# Patient Record
Sex: Male | Born: 1945
Health system: Southern US, Community
[De-identification: ages and names within clinical notes are randomized; demographics above are authoritative.]

## PROBLEM LIST (undated history)

## (undated) DIAGNOSIS — E7800 Pure hypercholesterolemia, unspecified: Secondary | ICD-10-CM

## (undated) DIAGNOSIS — I10 Essential (primary) hypertension: Secondary | ICD-10-CM

## (undated) DIAGNOSIS — Z9889 Other specified postprocedural states: Secondary | ICD-10-CM

## (undated) HISTORY — DX: Pure hypercholesterolemia, unspecified: E78.00

## (undated) HISTORY — DX: Essential (primary) hypertension: I10

## (undated) HISTORY — DX: Other specified postprocedural states: Z98.890

---

## 2003-04-14 DIAGNOSIS — Z9889 Other specified postprocedural states: Secondary | ICD-10-CM

## 2003-04-14 HISTORY — PX: COLONOSCOPY: SHX174

## 2003-04-14 HISTORY — DX: Other specified postprocedural states: Z98.890

## 2003-05-25 ENCOUNTER — Ambulatory Visit (HOSPITAL_COMMUNITY): Admission: RE | Admit: 2003-05-25 | Discharge: 2003-05-25 | Payer: Self-pay | Admitting: Internal Medicine

## 2010-08-07 ENCOUNTER — Encounter: Payer: Self-pay | Admitting: Gastroenterology

## 2010-08-07 ENCOUNTER — Ambulatory Visit (HOSPITAL_COMMUNITY)
Admission: RE | Admit: 2010-08-07 | Discharge: 2010-08-07 | Disposition: A | Discharge status: Home / Self Care | Payer: Medicare Other | Source: Ambulatory Visit | Attending: Gastroenterology | Admitting: Gastroenterology

## 2010-08-07 ENCOUNTER — Ambulatory Visit (INDEPENDENT_AMBULATORY_CARE_PROVIDER_SITE_OTHER): Payer: Medicare Other | Admitting: Gastroenterology

## 2010-08-07 VITALS — BP 155/83 | HR 93 | Temp 99.3°F | Ht 68.0 in | Wt 166.6 lb

## 2010-08-07 DIAGNOSIS — R1032 Left lower quadrant pain: Secondary | ICD-10-CM | POA: Insufficient documentation

## 2010-08-07 DIAGNOSIS — N201 Calculus of ureter: Secondary | ICD-10-CM | POA: Insufficient documentation

## 2010-08-07 DIAGNOSIS — K7689 Other specified diseases of liver: Secondary | ICD-10-CM | POA: Insufficient documentation

## 2010-08-07 DIAGNOSIS — Z01818 Encounter for other preprocedural examination: Secondary | ICD-10-CM

## 2010-08-07 LAB — CREATININE, SERUM: Creat: 1.52 mg/dL — ABNORMAL HIGH (ref 0.40–1.50)

## 2010-08-07 MED ORDER — IOHEXOL 300 MG/ML  SOLN
100.0000 mL | Freq: Once | INTRAMUSCULAR | Status: AC | PRN
  Administered 2010-08-07: 100 mL via INTRAVENOUS

## 2010-08-07 NOTE — Progress Notes (Signed)
Referring Provider: Glo Herring., MD Primary Care Physician:  Glo Herring., MD Primary Gastroenterologist:  Dr. Gala Romney  Chief Complaint  Patient presents with  . Abdominal Pain    lower abdomin  . Pre-op Exam    cosult for colonoscopy    HPI:  Gordon Bell is a 65 y.o. male here as a referral from Dr. Gerarda Fraction for lower abdominal pain. Pt states on Tues morning, woke up with severe lower abdominal pain, LLQ pain, shooting. Got up to urinate and had +hematuria.  Went to see PCP on Wednesday, given Cipro. No BM since Tuesday am. Feeling nauseated. No further pain since 2am last night. If stands up, eases off. Otherwise, if sitting or laying, uncomfortable. Pain is intermittent. Usually has BM every morning. No blood in stool. Did say felt like had chills, but no fever. Has felt nauseated since abdominal pain started. No significant wt loss.  Will occasionally take Pepcid once or twice a week. No indigestion every day. No dysphagia.  Last colonoscopy in 2005: sigmoid diverticula, adenomatous polyp. Was due for repeat in 2008 but did not complete.    Past Medical History  Diagnosis Date  . HTN (hypertension)   . Hypercholesterolemia   . S/P colonoscopy 2005    Past Surgical History  Procedure Date  . None     Current Outpatient Prescriptions  Medication Sig Dispense Refill  . aspirin 81 MG tablet Take 81 mg by mouth daily.        Marland Kitchen CIPRO 500 MG tablet Take 1 tablet by mouth Twice daily.      . felodipine (PLENDIL) 10 MG 24 hr tablet Take 10 mg by mouth daily.        . fish oil-omega-3 fatty acids 1000 MG capsule Take 2 g by mouth daily.        . niacin-lovastatin (ADVICOR) 500-20 MG 24 hr tablet Take 1 tablet by mouth at bedtime.          Allergies as of 08/07/2010  . (No Known Allergies)   Family History  Problem Relation Age of Onset  . Diabetes Mother     deceased  . Colon cancer Brother 55  . Colon cancer Brother 1     History   Social History  .  Marital Status: Married    Spouse Name: N/A    Number of Children: N/A  . Years of Education: N/A   Occupational History  . Not on file.   Social History Main Topics  . Smoking status: Never Smoker   . Smokeless tobacco: Current User    Types: Chew  . Alcohol Use: 8.4 oz/week    14 Cans of beer per week     1-2 beers per day  . Drug Use: No  . Sexually Active: Yes -- Male partner(s)    Birth Control/ Protection: None     spouse    Review of Systems: Gen: Denies any fever, chills, sweats, anorexia, fatigue, weakness, malaise, weight loss, and sleep disorder CV: Denies chest pain, angina, palpitations, syncope, orthopnea, PND, peripheral edema, and claudication. Resp: Denies dyspnea at rest, dyspnea with exercise, cough, sputum, wheezing, coughing up blood, and pleurisy. GI: Denies vomiting blood, jaundice, and fecal incontinence.   Denies dysphagia or odynophagia. See HPI.  GU : Denies urinary burning, blood in urine, urinary frequency, urinary hesitancy, nocturnal urination, and urinary incontinence. MS: Denies joint pain, limitation of movement, and swelling, stiffness, low back pain, extremity pain. Denies muscle weakness, cramps, atrophy.  Derm: Denies rash, itching, dry skin, hives, moles, warts, or unhealing ulcers.  Psych: Denies depression, anxiety, memory loss, suicidal ideation, hallucinations, paranoia, and confusion. Heme: Denies bruising, bleeding, and enlarged lymph nodes.  Physical Exam: BP 155/83  Pulse 93  Temp 99.3 F (37.4 C)  Ht 5\' 8"  (1.727 m)  Wt 166 lb 9.6 oz (75.569 kg)  BMI 25.33 kg/m2 General:   Alert,  Well-developed, well-nourished, pleasant and cooperative in NAD Head:  Normocephalic and atraumatic. Eyes:  Sclera clear, no icterus.   Conjunctiva pink. Ears:  Normal auditory acuity. Nose:  No deformity, discharge,  or lesions. Mouth:  No deformity or lesions, dentition normal. Neck:  Supple; no masses or thyromegaly. Lungs:  Clear throughout  to auscultation.   No wheezes, crackles, or rhonchi. No acute distress. Heart:  Regular rate and rhythm; no murmurs, clicks, rubs,  or gallops. Abdomen:  Soft, nontender and nondistended. No masses, hepatosplenomegaly or hernias noted. Normal bowel sounds, without guarding, and without rebound.   Rectal:  Deferred until time of colonoscopy.   Msk:  Symmetrical without gross deformities. Normal posture. Extremities:  Without clubbing or edema. Neurologic:  Alert and  oriented x4;  grossly normal neurologically. Skin:  Intact without significant lesions or rashes. Cervical Nodes:  No significant cervical adenopathy. Psych:  Alert and cooperative. Normal mood and affect.

## 2010-08-07 NOTE — Assessment & Plan Note (Signed)
65 year old Caucasian male with acute onset of lower abdominal and LLQ pain on Tuesday morning, felt "shooting", severe in nature. +hematuria. Given Cipro by PCP yesterday. +chills, no fever documented. +nausea, decreased appetite due to pain. No change in bowel habits, melena or brbpr. No pain since early this morning (2am on Thursday). Colonoscopy in 2005 with diverticula, actually past-due for repeat (hx of adenomatous polyp ). Although pain has somewhat resolved, I would like to evaluate radiologically prior to proceeding with colonoscopy, which will ultimately be needed. Diverticulitis remains in the differentials. +FH of colon ca, 2 brothers.  CT abd/pelvis today. After review of CT, will proceed with colonoscopy.  Further rec's to follow

## 2010-08-08 NOTE — Progress Notes (Signed)
Cc to PCP 

## 2010-08-11 ENCOUNTER — Encounter: Payer: Self-pay | Admitting: Gastroenterology

## 2010-08-11 NOTE — Telephone Encounter (Signed)
Error

## 2010-08-13 ENCOUNTER — Encounter: Payer: Self-pay | Admitting: Gastroenterology

## 2010-08-18 ENCOUNTER — Ambulatory Visit (HOSPITAL_COMMUNITY): Payer: Medicare Other

## 2010-08-18 ENCOUNTER — Ambulatory Visit (HOSPITAL_COMMUNITY)
Admission: RE | Admit: 2010-08-18 | Discharge: 2010-08-18 | Disposition: A | Discharge status: Home / Self Care | Payer: Medicare Other | Source: Ambulatory Visit | Attending: Urology | Admitting: Urology

## 2010-08-18 DIAGNOSIS — I10 Essential (primary) hypertension: Secondary | ICD-10-CM | POA: Insufficient documentation

## 2010-08-18 DIAGNOSIS — N201 Calculus of ureter: Secondary | ICD-10-CM | POA: Insufficient documentation

## 2010-10-28 NOTE — Telephone Encounter (Signed)
Opened in error

## 2012-01-22 IMAGING — CT CT ABD-PELV W/ CM
2 of 5 series · 16 of 46 positions shown, 18 images · IV contrast (Omnipaque 300)
Comparison: None.

CLINICAL DATA: Left lower quadrant/abdominal pain.  Rule out
diverticulitis.

CT ABDOMEN AND PELVIS WITH CONTRAST
TECHNIQUE: Multidetector CT imaging of the abdomen and pelvis was
performed following the standard protocol during bolus
administration of intravenous contrast.
Contrast: 100  ml Gmnipaque-MFF

[Series 2: abd_pel_with 5.0 b40f · axial · 0.76mm/px · z∈[-461,-51]mm · 13 of 94 slices shown, 15 images]
[im 6/94  soft-tissue]
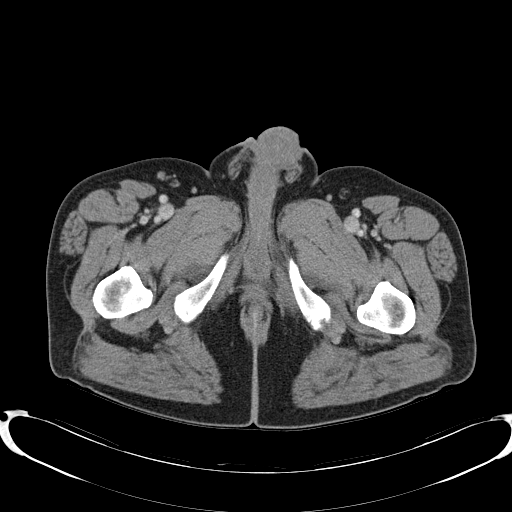
[im 6/94  bone]
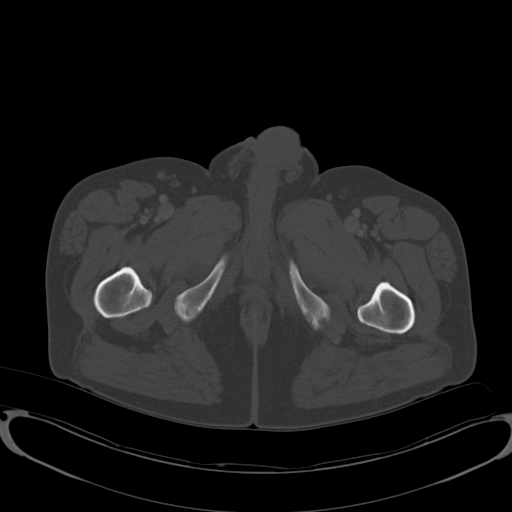
[im 11/94  soft-tissue]
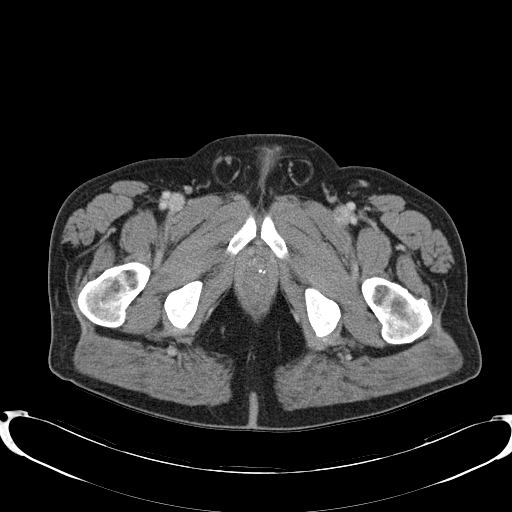
[im 21/94  soft-tissue]
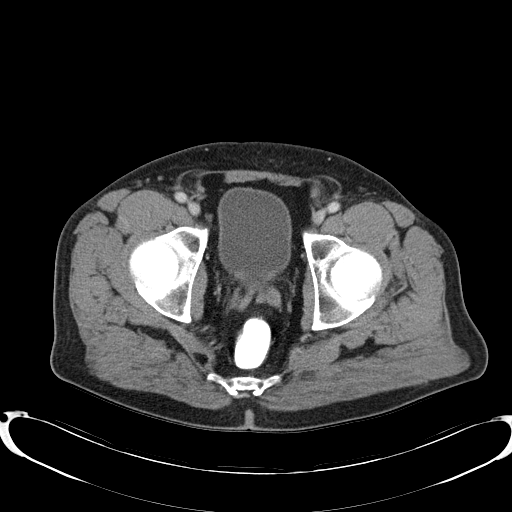
[im 26/94  soft-tissue]
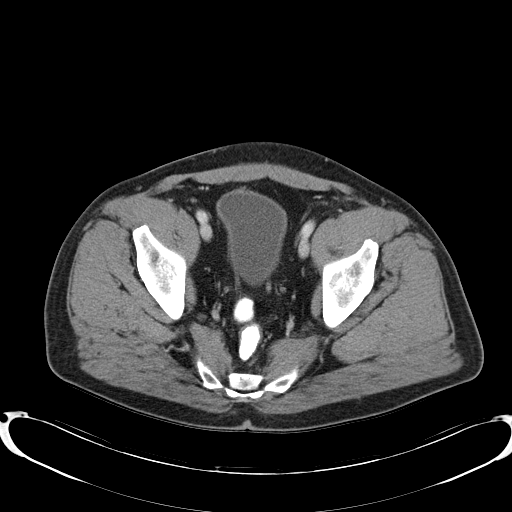
[im 32/94  soft-tissue]
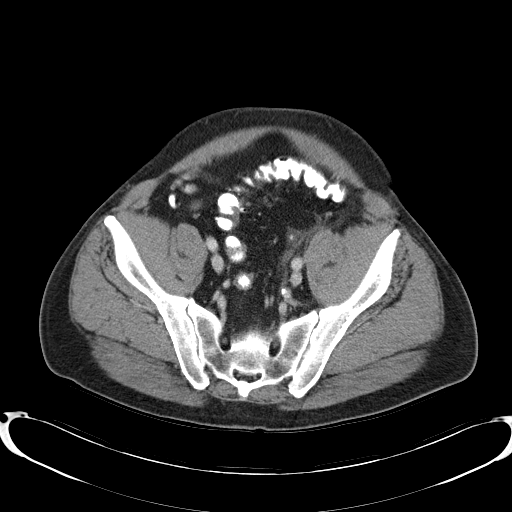
[im 42/94  soft-tissue]
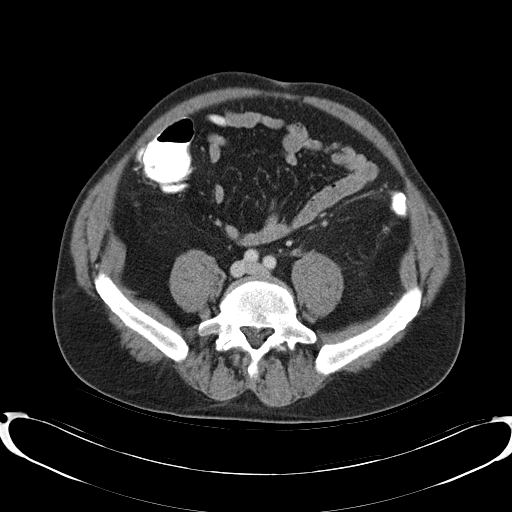
[im 47/94  soft-tissue]
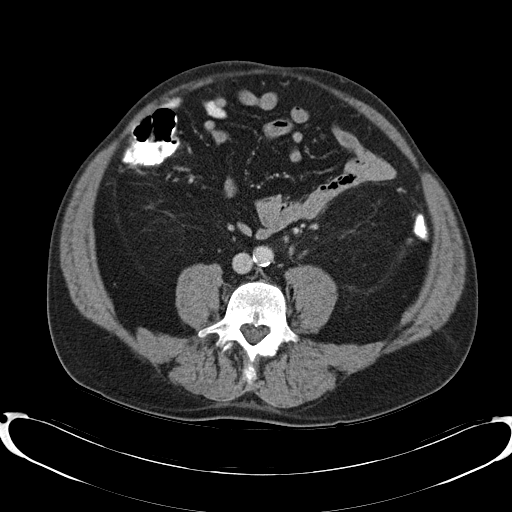
[im 52/94  soft-tissue]
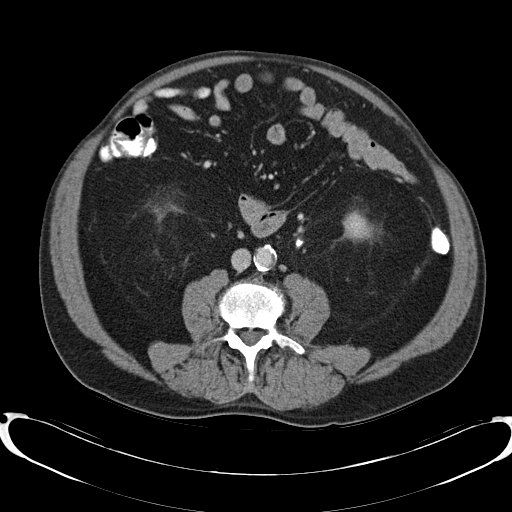
[im 63/94  soft-tissue]
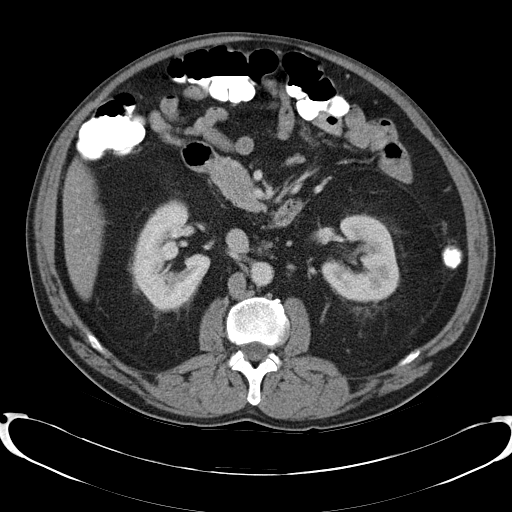
[im 63/94  bone]
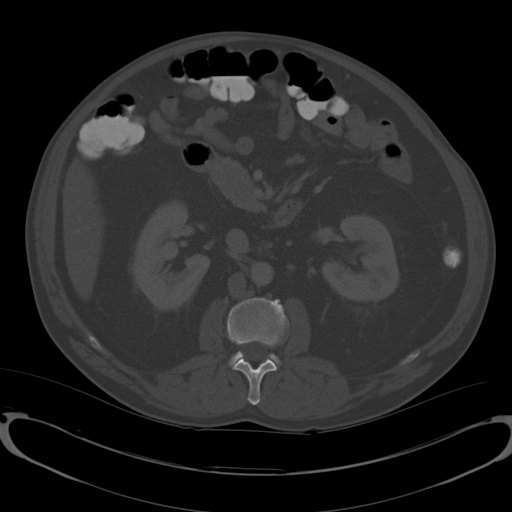
[im 68/94  soft-tissue]
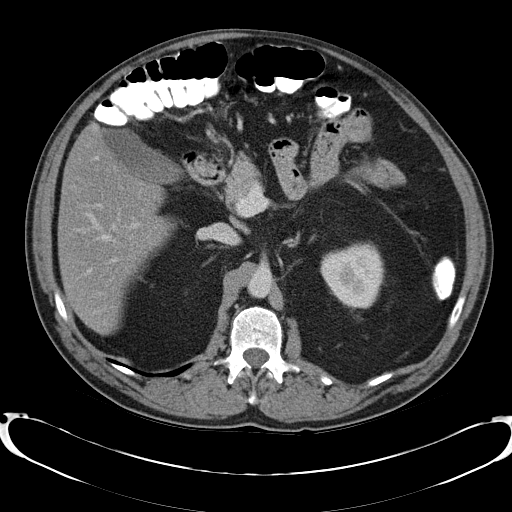
[im 73/94  soft-tissue]
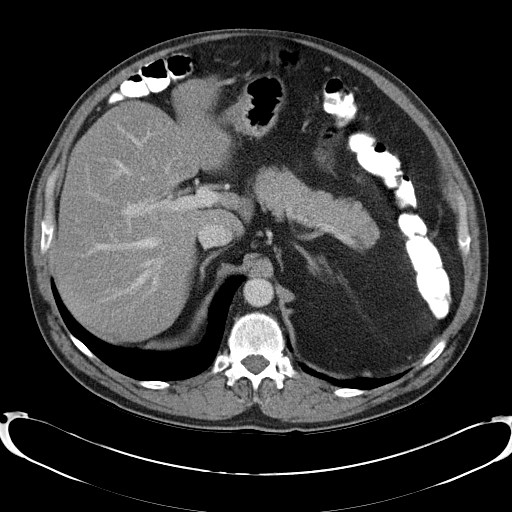
[im 83/94  soft-tissue]
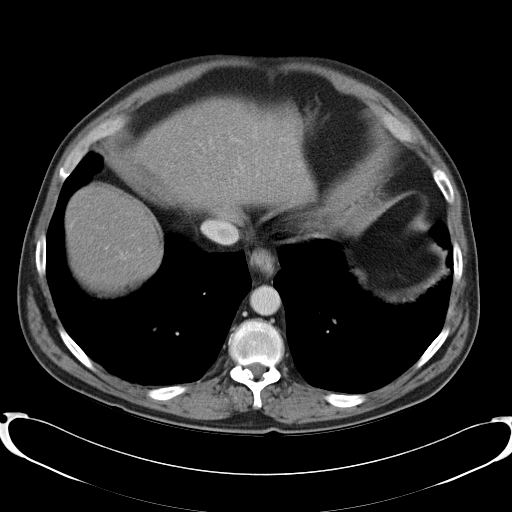
[im 88/94  soft-tissue]
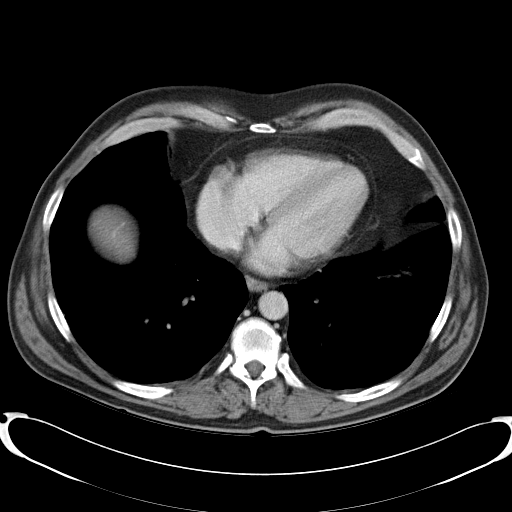

[Series 4: abd_pel_with 3.0 spo cor · coronal · 0.73mm/px · 3 of 98 slices shown]
[im 33/98  soft-tissue]
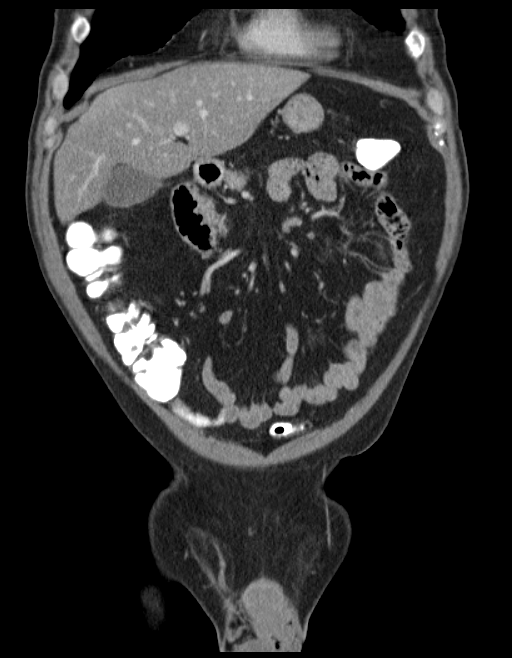
[im 44/98  soft-tissue]
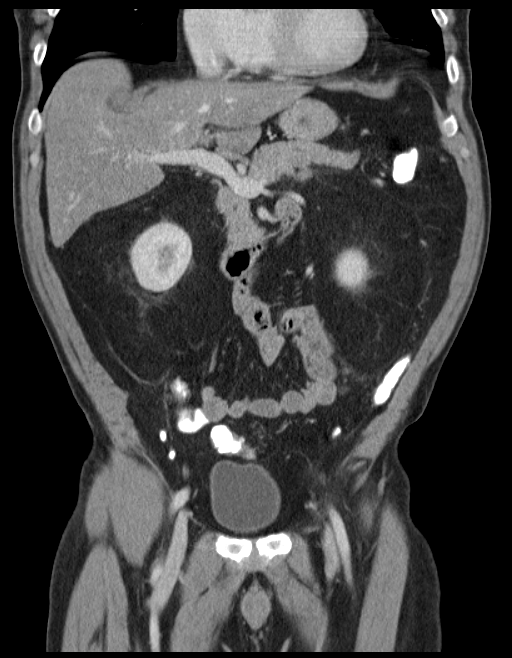
[im 54/98  soft-tissue]
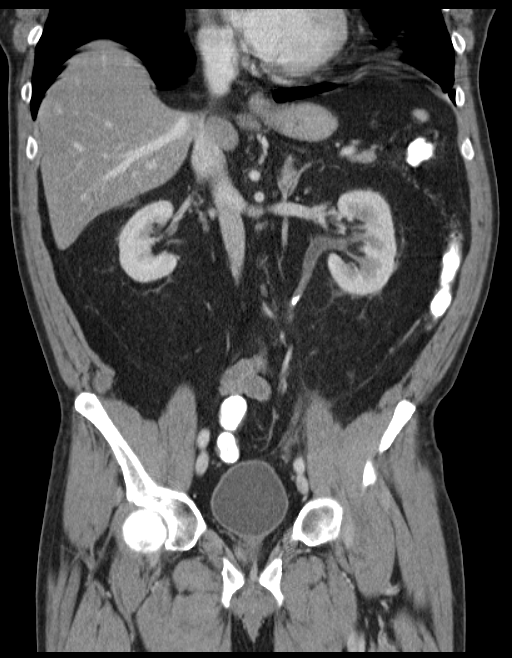

[16 of 46 positions shown; findings below may reference images not displayed]

FINDINGS: Motion degradation involving the lung bases.  Bibasilar
scar or atelectasis. Normal heart size without pericardial or
pleural effusion.  Coronary artery atherosclerosis is suboptimally
evaluated.

Moderate hepatic steatosis.  A 9 mm subcapsular low density liver
lesion in the right lobe is a small cyst.  Normal spleen.
Underdistended proximal stomach.  Normal pancreas, gallbladder,
biliary tract, adrenal glands.

Punctate left renal collecting system calculus.  Mildly delayed
excretion of contrast from the left kidney.  Mild left hydroureter
to the level of a 6 mm calculus on transverse image 43 and 9 mm on
coronal image 54. Edema extends inferiorly to the level of the
pelvic brain.

A calcification is identified immediately posterior to the left
ureter on coronal image 53 and is felt to be vascular.

No retroperitoneal or retrocrural adenopathy.

Suspect mild perirenal lipomatosis with displacement of the colon
anteriorly.

Normal terminal ileum and appendix.  Normal small bowel without
abdominal ascites.

Bilateral fat containing inguinal hernias. No pelvic adenopathy.
Normal urinary bladder and prostate.  No significant free fluid.
No acute osseous abnormality.
IMPRESSION: 1.  Mild urinary tract obstruction due to a proximal ureteric stone
which measures maximally 9 mm.
2.  Left renal punctate collecting system calculus.
3.  Hepatic steatosis.
4.  Suspect perirenal lipomatosis.
5.  A focus of calcification at the level of the pelvic brim is
favored to be posterior to the left ureter.  A second ureteric
stone is felt less likely.

## 2012-02-02 IMAGING — CR DG ABDOMEN 1V
1 series · 1 of 1 positions shown · non-contrast
Comparison: CT of the abdomen 08/03/2010

CLINICAL DATA: Pre lithotripsy

ABDOMEN - 1 VIEW

[t abdomen supine]
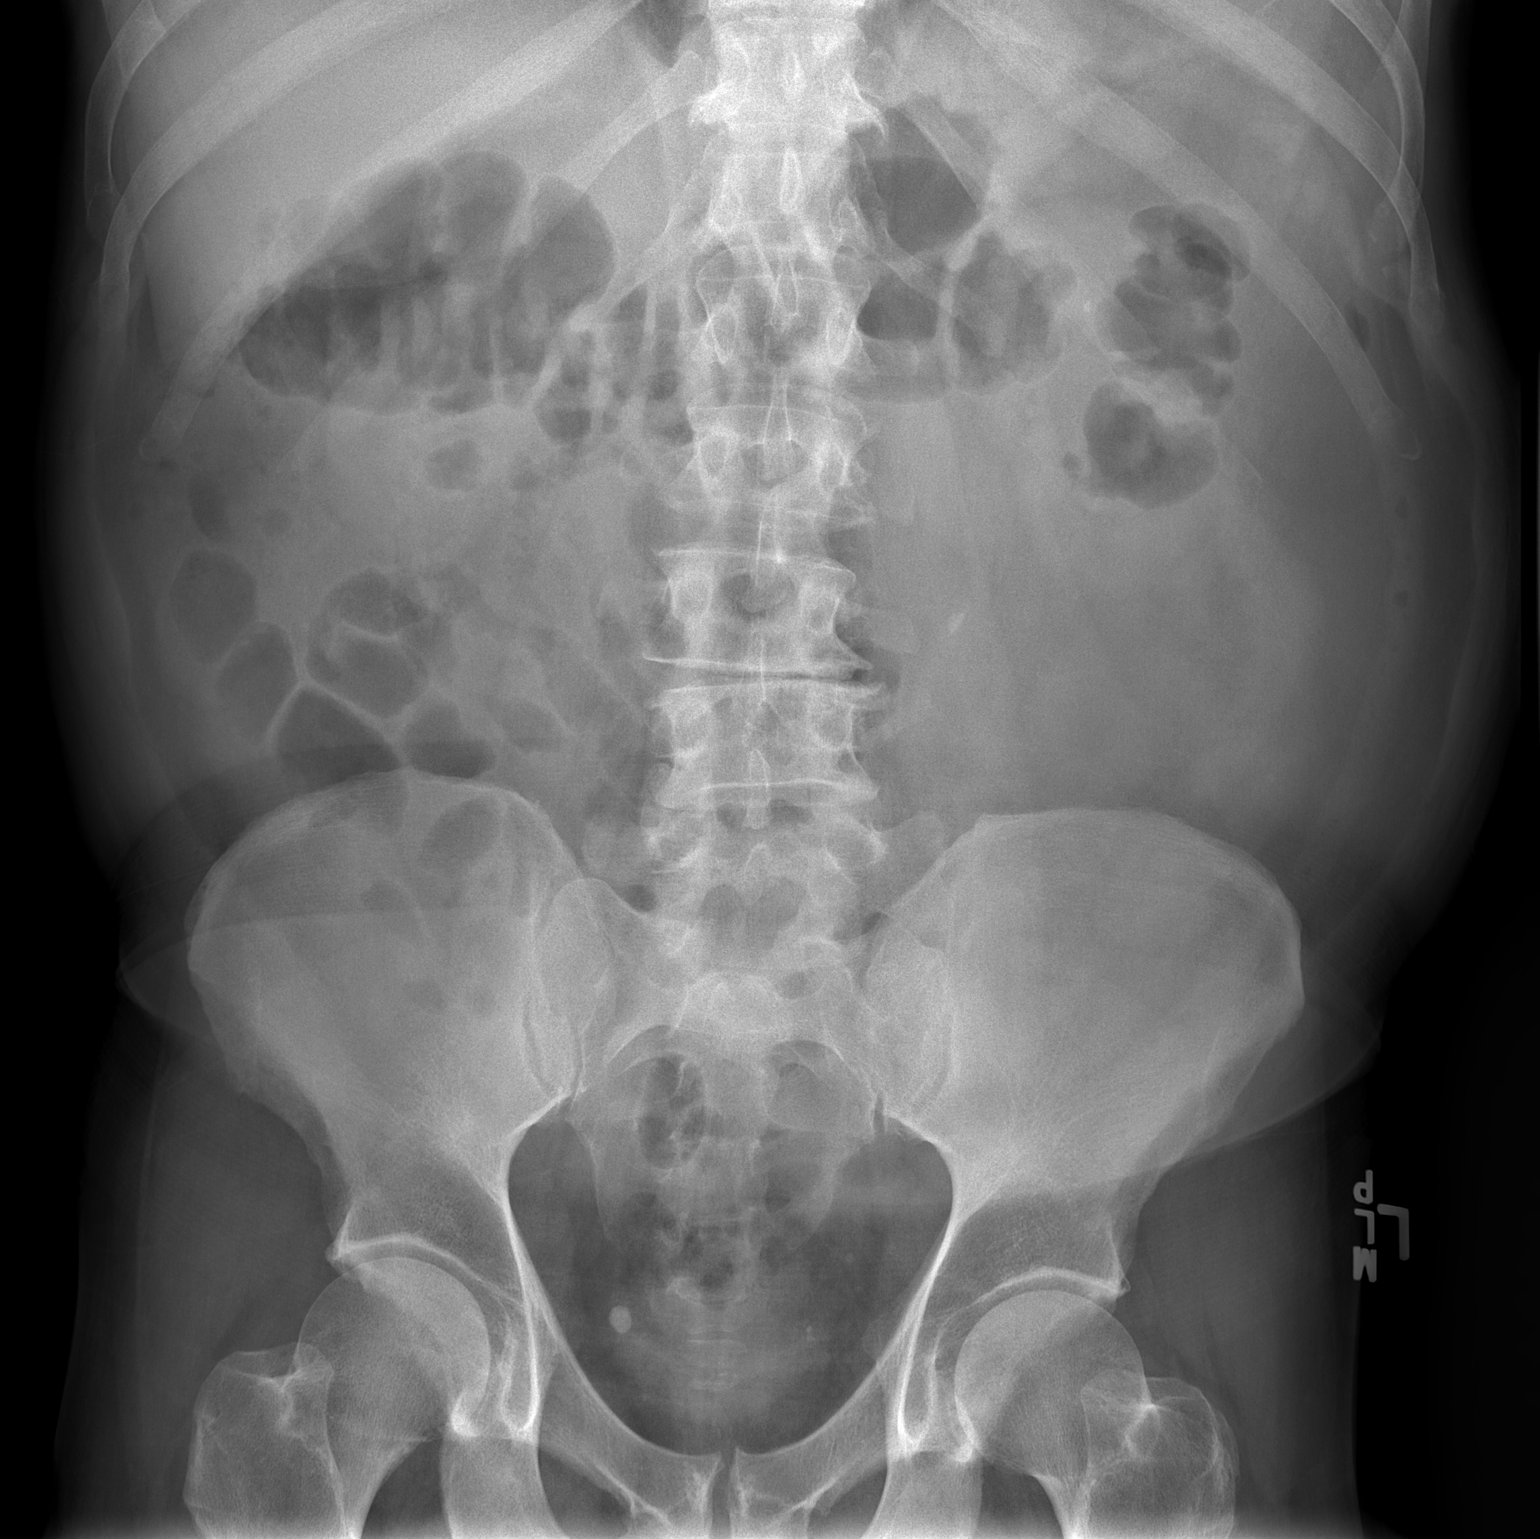

[1 of 1 positions shown; findings below may reference images not displayed]

FINDINGS: There is a calculus in the left upper/mid ureter
measuring approximately 6.6 x 4.4 mm.  There are 1 or 2 small left
renal calculi noted.
IMPRESSION: Left renal and ureteral calculi as above.

## 2013-03-06 ENCOUNTER — Other Ambulatory Visit (HOSPITAL_COMMUNITY): Payer: Self-pay | Admitting: Family Medicine

## 2013-03-06 DIAGNOSIS — Z139 Encounter for screening, unspecified: Secondary | ICD-10-CM

## 2013-03-06 DIAGNOSIS — F17200 Nicotine dependence, unspecified, uncomplicated: Secondary | ICD-10-CM

## 2013-03-13 ENCOUNTER — Ambulatory Visit (HOSPITAL_COMMUNITY)
Admission: RE | Admit: 2013-03-13 | Discharge: 2013-03-13 | Disposition: A | Discharge status: Home / Self Care | Payer: Medicare Other | Source: Ambulatory Visit | Attending: Family Medicine | Admitting: Family Medicine

## 2013-03-13 DIAGNOSIS — F17200 Nicotine dependence, unspecified, uncomplicated: Secondary | ICD-10-CM | POA: Insufficient documentation

## 2013-03-13 DIAGNOSIS — Z139 Encounter for screening, unspecified: Secondary | ICD-10-CM | POA: Insufficient documentation

## 2014-08-29 ENCOUNTER — Encounter: Payer: Self-pay | Admitting: Internal Medicine

## 2014-09-12 ENCOUNTER — Ambulatory Visit (INDEPENDENT_AMBULATORY_CARE_PROVIDER_SITE_OTHER): Payer: Medicare Other | Admitting: Nurse Practitioner

## 2014-09-12 ENCOUNTER — Other Ambulatory Visit: Payer: Self-pay

## 2014-09-12 ENCOUNTER — Encounter (INDEPENDENT_AMBULATORY_CARE_PROVIDER_SITE_OTHER): Payer: Self-pay

## 2014-09-12 ENCOUNTER — Encounter: Payer: Self-pay | Admitting: Nurse Practitioner

## 2014-09-12 VITALS — BP 147/78 | HR 66 | Temp 98.4°F | Ht 69.0 in | Wt 155.8 lb

## 2014-09-12 DIAGNOSIS — Z9189 Other specified personal risk factors, not elsewhere classified: Secondary | ICD-10-CM | POA: Diagnosis not present

## 2014-09-12 DIAGNOSIS — R198 Other specified symptoms and signs involving the digestive system and abdomen: Secondary | ICD-10-CM

## 2014-09-12 DIAGNOSIS — Z86010 Personal history of colonic polyps: Secondary | ICD-10-CM

## 2014-09-12 LAB — CBC WITH DIFFERENTIAL/PLATELET
BASOS ABS: 0.1 10*3/uL (ref 0.0–0.1)
Basophils Relative: 1 % (ref 0–1)
EOS ABS: 0.1 10*3/uL (ref 0.0–0.7)
Eosinophils Relative: 1 % (ref 0–5)
HCT: 49.6 % (ref 39.0–52.0)
Hemoglobin: 16.9 g/dL (ref 13.0–17.0)
LYMPHS PCT: 31 % (ref 12–46)
Lymphs Abs: 2.6 10*3/uL (ref 0.7–4.0)
MCH: 33.7 pg (ref 26.0–34.0)
MCHC: 34.1 g/dL (ref 30.0–36.0)
MCV: 98.8 fL (ref 78.0–100.0)
MONOS PCT: 10 % (ref 3–12)
MPV: 9.4 fL (ref 8.6–12.4)
Monocytes Absolute: 0.8 10*3/uL (ref 0.1–1.0)
Neutro Abs: 4.8 10*3/uL (ref 1.7–7.7)
Neutrophils Relative %: 57 % (ref 43–77)
Platelets: 324 10*3/uL (ref 150–400)
RBC: 5.02 MIL/uL (ref 4.22–5.81)
RDW: 12.4 % (ref 11.5–15.5)
WBC: 8.4 10*3/uL (ref 4.0–10.5)

## 2014-09-12 MED ORDER — PEG 3350-KCL-NA BICARB-NACL 420 G PO SOLR: 4000.0000 mL | Freq: Once | ORAL | Status: AC

## 2014-09-12 NOTE — Patient Instructions (Signed)
1. We will schedule your procedure (colonoscopy) for you. 2. Further recommendations to be based on results of your procedure. 3. Have your labs drawn the next day or two.

## 2014-09-12 NOTE — Progress Notes (Signed)
Primary Care Physician:  Purvis Kilts, MD Primary Gastroenterologist:  Dr. Gala Romney  Chief Complaint  Patient presents with  . Colonoscopy    HPI:   69 year old male presents on referral from PCP for evaluation for colonoscopy as well as abdominal fullness. PCP notes reviewed. Last saw PCP on 08/09/2014 which noted a protuberant abdomen and questionable hepatomegaly as well as need for routine 10 year colonoscopy. No colonoscopy procedural notes could be found in the each our system.  Today he states his last colonoscopy was 10 years ago at [facility]. States he was supposed to have it 5 years ago ("I think it was") but it was delayed due to kidney stone surgery. Per the patient his last colonoscopy had polypectomy x 2 which were precancerous. Denies abdominal pain, hematochezia, melena, N/V, fever, chills, chest pain, dyspnea. Has had some minimal weight loss (4-5 pounds in the last month per him) but states he hasn't been eating as much. Denies any additional upper or lower GI symptoms.  Past Medical History  Diagnosis Date  . HTN (hypertension)   . Hypercholesterolemia   . S/P colonoscopy 2005    Past Surgical History  Procedure Laterality Date  . None      Current Outpatient Prescriptions  Medication Sig Dispense Refill  . aspirin 81 MG tablet Take 81 mg by mouth daily.      . fish oil-omega-3 fatty acids 1000 MG capsule Take 2 g by mouth daily.      Marland Kitchen lisinopril (PRINIVIL,ZESTRIL) 10 MG tablet Take 10 mg by mouth daily.   11  . lovastatin (MEVACOR) 20 MG tablet Take 20 mg by mouth daily at 6 PM.   11  . niacin (NIASPAN) 500 MG CR tablet Take 500 mg by mouth at bedtime.    Marland Kitchen CIPRO 500 MG tablet Take 1 tablet by mouth Twice daily.    . felodipine (PLENDIL) 10 MG 24 hr tablet Take 10 mg by mouth daily.      . niacin-lovastatin (ADVICOR) 500-20 MG 24 hr tablet Take 1 tablet by mouth at bedtime.       No current facility-administered medications for this visit.     Allergies as of 09/12/2014  . (No Known Allergies)    Family History  Problem Relation Age of Onset  . Diabetes Mother     deceased  . Colon cancer Brother 64  . Colon cancer Brother 48    History   Social History  . Marital Status: Married    Spouse Name: N/A  . Number of Children: N/A  . Years of Education: N/A   Occupational History  . Not on file.   Social History Main Topics  . Smoking status: Never Smoker   . Smokeless tobacco: Current User    Types: Chew  . Alcohol Use: 8.4 oz/week    14 Cans of beer per week     Comment: 1-2 beers per day  . Drug Use: No  . Sexual Activity:    Partners: Female    Museum/gallery curator: None     Comment: spouse   Other Topics Concern  . Not on file   Social History Narrative    Review of Systems: General: Negative for anorexia, fever, chills, fatigue, weakness. ENT: Negative for hoarseness, difficulty swallowing. CV: Negative for chest pain, angina, palpitations, peripheral edema.  Respiratory: Negative for dyspnea at rest, cough, wheezing.  GI: See history of present illness. Derm: Negative for rash or itching.  Neuro:  Negative for weakness, memory loss, confusion.  Endo: Mild weight loss in the past month.  Heme: Negative for bruising or bleeding. Allergy: Negative for rash or hives.    Physical Exam: BP 147/78 mmHg  Pulse 66  Temp(Src) 98.4 F (36.9 C) (Oral)  Ht 5\' 9"  (1.753 m)  Wt 155 lb 12.8 oz (70.67 kg)  BMI 23.00 kg/m2 General:   Alert and oriented. Pleasant and cooperative. Well-nourished and well-developed.  Head:  Normocephalic and atraumatic. Eyes:  Without icterus, sclera clear and conjunctiva pink.  Ears:  Normal auditory acuity. Cardiovascular:  S1, S2 present without murmurs appreciated. Normal pulses noted. Extremities without clubbing or edema. Respiratory:  Clear to auscultation bilaterally. No wheezes, rales, or rhonchi. No distress.  Gastrointestinal:  +BS, soft. Abdomen is  rounded and slightly protuberant but no definitive hepatomegaly noted. No guarding or rebound. No masses appreciated.  Rectal:  Deferred  Skin:  Intact without significant lesions or rashes. Neurologic:  Alert and oriented x4;  grossly normal neurologically. Psych:  Alert and cooperative. Normal mood and affect. Heme/Lymph/Immune: No excessive bruising noted.    09/12/2014 3:13 PM

## 2014-09-13 LAB — COMPREHENSIVE METABOLIC PANEL
ALT: 53 U/L (ref 0–53)
AST: 39 U/L — ABNORMAL HIGH (ref 0–37)
Albumin: 4.4 g/dL (ref 3.5–5.2)
Alkaline Phosphatase: 52 U/L (ref 39–117)
BUN: 11 mg/dL (ref 6–23)
CO2: 25 meq/L (ref 19–32)
Calcium: 9.3 mg/dL (ref 8.4–10.5)
Chloride: 101 mEq/L (ref 96–112)
Creat: 0.97 mg/dL (ref 0.50–1.35)
Glucose, Bld: 88 mg/dL (ref 70–99)
Potassium: 4.8 mEq/L (ref 3.5–5.3)
SODIUM: 139 meq/L (ref 135–145)
TOTAL PROTEIN: 7.1 g/dL (ref 6.0–8.3)
Total Bilirubin: 0.7 mg/dL (ref 0.2–1.2)

## 2014-09-14 NOTE — Assessment & Plan Note (Signed)
Patient with noted abdominal fullness. PCP notes indicate protuberant abdomen and questionable hepatomegaly. On exam his abdomen is somewhat protuberant, however no definitive hepatomegaly could be appreciated. However, his exam was a bit limited by his abdominal size. Of note CT of abdomen and pelvis in 2012 showed hepatic steatosis. For this reason we'll check CBC, CMP. He is due for surveillance colonoscopy (see high risk colon cancer assessment and plan note).

## 2014-09-14 NOTE — Assessment & Plan Note (Signed)
69 year old male high risk colon cancer due to family history with both brothers having colon cancer, one diagnosed age 28 any other diagnosed age 65. Last colonoscopy 10 years ago at [facility]. The records are not available in the system, I have requested these. Per patient at his last colonoscopy 2 polyps were removed which were found to be precancerous and recommended a 5 year repeat colonoscopy. It is been 10 years since his last colonoscopy. He initially dilated because of kidney stone surgery and forgot to reschedule. He is having some abdominal fullness at this point and some unintentional weight loss of approximately 4-5 pounds in last month, per the patient. Otherwise she is asymptomatic from a GI standpoint. This point we'll proceed with surveillance colonoscopy. I'll also check a CBC and CMP. Of note CT in 2012 showed hepatic steatosis. We will have him return for follow-up in 3 months.  Proceed with TCS with 25mg  pre-procedure phenergan with Dr. Gala Romney in near future: the risks, benefits, and alternatives have been discussed with the patient in detail. The patient states understanding and desires to proceed.  Patient is not on any anticoagulants other than daily 81 mg aspirin. He is not on any antidepressants, anxiolytics, her chronic pain medications. He does partake and daily alcohol use, denies drug use. Due to his chronic alcohol use provider 25 mg preprocedure Phenergan. Otherwise conscious sedation should be adequate for his procedure.

## 2014-09-26 NOTE — Progress Notes (Signed)
cc'd to pcp 

## 2014-10-10 ENCOUNTER — Encounter (HOSPITAL_COMMUNITY)
Admission: RE | Disposition: A | Discharge status: Home / Self Care | Payer: Self-pay | Source: Ambulatory Visit | Attending: Internal Medicine

## 2014-10-10 ENCOUNTER — Ambulatory Visit (HOSPITAL_COMMUNITY)
Admission: RE | Admit: 2014-10-10 | Discharge: 2014-10-10 | Disposition: A | Discharge status: Home / Self Care | Payer: Medicare Other | Source: Ambulatory Visit | Attending: Internal Medicine | Admitting: Internal Medicine

## 2014-10-10 DIAGNOSIS — R198 Other specified symptoms and signs involving the digestive system and abdomen: Secondary | ICD-10-CM

## 2014-10-10 DIAGNOSIS — D120 Benign neoplasm of cecum: Secondary | ICD-10-CM

## 2014-10-10 DIAGNOSIS — K5730 Diverticulosis of large intestine without perforation or abscess without bleeding: Secondary | ICD-10-CM | POA: Insufficient documentation

## 2014-10-10 DIAGNOSIS — I10 Essential (primary) hypertension: Secondary | ICD-10-CM | POA: Insufficient documentation

## 2014-10-10 DIAGNOSIS — Z800 Family history of malignant neoplasm of digestive organs: Secondary | ICD-10-CM

## 2014-10-10 DIAGNOSIS — R140 Abdominal distension (gaseous): Secondary | ICD-10-CM | POA: Diagnosis present

## 2014-10-10 DIAGNOSIS — Z7982 Long term (current) use of aspirin: Secondary | ICD-10-CM | POA: Diagnosis not present

## 2014-10-10 DIAGNOSIS — Z79899 Other long term (current) drug therapy: Secondary | ICD-10-CM | POA: Insufficient documentation

## 2014-10-10 DIAGNOSIS — Z860100 Personal history of colon polyps, unspecified: Secondary | ICD-10-CM

## 2014-10-10 DIAGNOSIS — E780 Pure hypercholesterolemia: Secondary | ICD-10-CM | POA: Insufficient documentation

## 2014-10-10 DIAGNOSIS — Z792 Long term (current) use of antibiotics: Secondary | ICD-10-CM | POA: Diagnosis not present

## 2014-10-10 DIAGNOSIS — K621 Rectal polyp: Secondary | ICD-10-CM | POA: Diagnosis not present

## 2014-10-10 DIAGNOSIS — F17220 Nicotine dependence, chewing tobacco, uncomplicated: Secondary | ICD-10-CM | POA: Diagnosis not present

## 2014-10-10 DIAGNOSIS — D128 Benign neoplasm of rectum: Secondary | ICD-10-CM

## 2014-10-10 HISTORY — PX: COLONOSCOPY: SHX5424

## 2014-10-10 SURGERY — COLONOSCOPY
Anesthesia: Moderate Sedation

## 2014-10-10 MED ORDER — MIDAZOLAM HCL 5 MG/5ML IJ SOLN
INTRAMUSCULAR | Status: DC | PRN
  Administered 2014-10-10: 2 mg via INTRAVENOUS
  Administered 2014-10-10: 1 mg via INTRAVENOUS

## 2014-10-10 MED ORDER — MEPERIDINE HCL 100 MG/ML IJ SOLN
INTRAMUSCULAR | Status: DC | PRN
  Administered 2014-10-10: 50 mg via INTRAVENOUS

## 2014-10-10 MED ORDER — ONDANSETRON HCL 4 MG/2ML IJ SOLN
INTRAMUSCULAR | Status: AC
  Filled 2014-10-10: qty 2

## 2014-10-10 MED ORDER — MEPERIDINE HCL 100 MG/ML IJ SOLN
INTRAMUSCULAR | Status: AC
  Filled 2014-10-10: qty 2

## 2014-10-10 MED ORDER — PROMETHAZINE HCL 25 MG/ML IJ SOLN
25.0000 mg | Freq: Once | INTRAMUSCULAR | Status: AC
Start: 2014-10-10 — End: 2014-10-10
  Administered 2014-10-10: 25 mg via INTRAVENOUS
  Filled 2014-10-10: qty 1

## 2014-10-10 MED ORDER — SODIUM CHLORIDE 0.9 % IV SOLN
INTRAVENOUS | Status: DC
  Administered 2014-10-10: 1000 mL via INTRAVENOUS

## 2014-10-10 MED ORDER — STERILE WATER FOR IRRIGATION IR SOLN
Status: DC | PRN
  Administered 2014-10-10: 14:00:00

## 2014-10-10 MED ORDER — ONDANSETRON HCL 4 MG/2ML IJ SOLN
INTRAMUSCULAR | Status: DC | PRN
Start: 2014-10-10 — End: 2014-10-10
  Administered 2014-10-10: 4 mg via INTRAVENOUS

## 2014-10-10 MED ORDER — SODIUM CHLORIDE 0.9 % IJ SOLN
INTRAMUSCULAR | Status: AC
  Filled 2014-10-10: qty 3

## 2014-10-10 MED ORDER — MIDAZOLAM HCL 5 MG/5ML IJ SOLN
INTRAMUSCULAR | Status: AC
  Filled 2014-10-10: qty 10

## 2014-10-10 NOTE — Interval H&P Note (Signed)
History and Physical Interval Note:  10/10/2014 2:14 PM  Gordon Bell  has presented today for surgery, with the diagnosis of abdominal fullness, hx of colon polyps  The various methods of treatment have been discussed with the patient and family. After consideration of risks, benefits and other options for treatment, the patient has consented to  Procedure(s) with comments: COLONOSCOPY (N/A) - 1400 - moved to 1:45 - office to notify pt as a surgical intervention .  The patient's history has been reviewed, patient examined, no change in status, stable for surgery.  I have reviewed the patient's chart and labs.  Questions were answered to the patient's satisfaction.     Gordon Bell   No change. Surveillance colonoscopy per plan.  The risks, benefits, limitations, alternatives and imponderables have been reviewed with the patient. Questions have been answered. All parties are agreeable.

## 2014-10-10 NOTE — H&P (View-Only) (Signed)
Primary Care Physician:  Purvis Kilts, MD Primary Gastroenterologist:  Dr. Gala Romney  Chief Complaint  Patient presents with  . Colonoscopy    HPI:   69 year old male presents on referral from PCP for evaluation for colonoscopy as well as abdominal fullness. PCP notes reviewed. Last saw PCP on 08/09/2014 which noted a protuberant abdomen and questionable hepatomegaly as well as need for routine 10 year colonoscopy. No colonoscopy procedural notes could be found in the each our system.  Today he states his last colonoscopy was 10 years ago at [facility]. States he was supposed to have it 5 years ago ("I think it was") but it was delayed due to kidney stone surgery. Per the patient his last colonoscopy had polypectomy x 2 which were precancerous. Denies abdominal pain, hematochezia, melena, N/V, fever, chills, chest pain, dyspnea. Has had some minimal weight loss (4-5 pounds in the last month per him) but states he hasn't been eating as much. Denies any additional upper or lower GI symptoms.  Past Medical History  Diagnosis Date  . HTN (hypertension)   . Hypercholesterolemia   . S/P colonoscopy 2005    Past Surgical History  Procedure Laterality Date  . None      Current Outpatient Prescriptions  Medication Sig Dispense Refill  . aspirin 81 MG tablet Take 81 mg by mouth daily.      . fish oil-omega-3 fatty acids 1000 MG capsule Take 2 g by mouth daily.      Marland Kitchen lisinopril (PRINIVIL,ZESTRIL) 10 MG tablet Take 10 mg by mouth daily.   11  . lovastatin (MEVACOR) 20 MG tablet Take 20 mg by mouth daily at 6 PM.   11  . niacin (NIASPAN) 500 MG CR tablet Take 500 mg by mouth at bedtime.    Marland Kitchen CIPRO 500 MG tablet Take 1 tablet by mouth Twice daily.    . felodipine (PLENDIL) 10 MG 24 hr tablet Take 10 mg by mouth daily.      . niacin-lovastatin (ADVICOR) 500-20 MG 24 hr tablet Take 1 tablet by mouth at bedtime.       No current facility-administered medications for this visit.     Allergies as of 09/12/2014  . (No Known Allergies)    Family History  Problem Relation Age of Onset  . Diabetes Mother     deceased  . Colon cancer Brother 81  . Colon cancer Brother 75    History   Social History  . Marital Status: Married    Spouse Name: N/A  . Number of Children: N/A  . Years of Education: N/A   Occupational History  . Not on file.   Social History Main Topics  . Smoking status: Never Smoker   . Smokeless tobacco: Current User    Types: Chew  . Alcohol Use: 8.4 oz/week    14 Cans of beer per week     Comment: 1-2 beers per day  . Drug Use: No  . Sexual Activity:    Partners: Female    Museum/gallery curator: None     Comment: spouse   Other Topics Concern  . Not on file   Social History Narrative    Review of Systems: General: Negative for anorexia, fever, chills, fatigue, weakness. ENT: Negative for hoarseness, difficulty swallowing. CV: Negative for chest pain, angina, palpitations, peripheral edema.  Respiratory: Negative for dyspnea at rest, cough, wheezing.  GI: See history of present illness. Derm: Negative for rash or itching.  Neuro:  Negative for weakness, memory loss, confusion.  Endo: Mild weight loss in the past month.  Heme: Negative for bruising or bleeding. Allergy: Negative for rash or hives.    Physical Exam: BP 147/78 mmHg  Pulse 66  Temp(Src) 98.4 F (36.9 C) (Oral)  Ht 5\' 9"  (1.753 m)  Wt 155 lb 12.8 oz (70.67 kg)  BMI 23.00 kg/m2 General:   Alert and oriented. Pleasant and cooperative. Well-nourished and well-developed.  Head:  Normocephalic and atraumatic. Eyes:  Without icterus, sclera clear and conjunctiva pink.  Ears:  Normal auditory acuity. Cardiovascular:  S1, S2 present without murmurs appreciated. Normal pulses noted. Extremities without clubbing or edema. Respiratory:  Clear to auscultation bilaterally. No wheezes, rales, or rhonchi. No distress.  Gastrointestinal:  +BS, soft. Abdomen is  rounded and slightly protuberant but no definitive hepatomegaly noted. No guarding or rebound. No masses appreciated.  Rectal:  Deferred  Skin:  Intact without significant lesions or rashes. Neurologic:  Alert and oriented x4;  grossly normal neurologically. Psych:  Alert and cooperative. Normal mood and affect. Heme/Lymph/Immune: No excessive bruising noted.    09/12/2014 3:13 PM

## 2014-10-10 NOTE — Discharge Instructions (Signed)
Polyp and diverticulosis information provided   Further recommendations to follow pending review of pathology report      Colonoscopy Discharge Instructions  Read the instructions outlined below and refer to this sheet in the next few weeks. These discharge instructions provide you with general information on caring for yourself after you leave the hospital. Your doctor may also give you specific instructions. While your treatment has been planned according to the most current medical practices available, unavoidable complications occasionally occur. If you have any problems or questions after discharge, call Dr. Gala Romney at [phone]. ACTIVITY  You may resume your regular activity, but move at a slower pace for the next 24 hours.   Take frequent rest periods for the next 24 hours.   Walking will help get rid of the air and reduce the bloated feeling in your belly (abdomen).   No driving for 24 hours (because of the medicine (anesthesia) used during the test).    Do not sign any important legal documents or operate any machinery for 24 hours (because of the anesthesia used during the test).  NUTRITION  Drink plenty of fluids.   You may resume your normal diet as instructed by your doctor.   Begin with a light meal and progress to your normal diet. Heavy or fried foods are harder to digest and may make you feel sick to your stomach (nauseated).   Avoid alcoholic beverages for 24 hours or as instructed.  MEDICATIONS  You may resume your normal medications unless your doctor tells you otherwise.  WHAT YOU CAN EXPECT TODAY  Some feelings of bloating in the abdomen.   Passage of more gas than usual.   Spotting of blood in your stool or on the toilet paper.  IF YOU HAD POLYPS REMOVED DURING THE COLONOSCOPY:  No aspirin products for 7 days or as instructed.   No alcohol for 7 days or as instructed.   Eat a soft diet for the next 24 hours.  FINDING OUT THE RESULTS OF YOUR  TEST Not all test results are available during your visit. If your test results are not back during the visit, make an appointment with your caregiver to find out the results. Do not assume everything is normal if you have not heard from your caregiver or the medical facility. It is important for you to follow up on all of your test results.  SEEK IMMEDIATE MEDICAL ATTENTION IF:  You have more than a spotting of blood in your stool.   Your belly is swollen (abdominal distention).   You are nauseated or vomiting.   You have a temperature over 101.   You have abdominal pain or discomfort that is severe or gets worse throughout the day.    Diverticulosis Diverticulosis is the condition that develops when small pouches (diverticula) form in the wall of your colon. Your colon, or large intestine, is where water is absorbed and stool is formed. The pouches form when the inside layer of your colon pushes through weak spots in the outer layers of your colon. CAUSES  No one knows exactly what causes diverticulosis. RISK FACTORS  Being older than 88. Your risk for this condition increases with age. Diverticulosis is rare in people younger than 40 years. By age 7, almost everyone has it.  Eating a low-fiber diet.  Being frequently constipated.  Being overweight.  Not getting enough exercise.  Smoking.  Taking over-the-counter pain medicines, like aspirin and ibuprofen. SYMPTOMS  Most people with diverticulosis do not  have symptoms. DIAGNOSIS  Because diverticulosis often has no symptoms, health care providers often discover the condition during an exam for other colon problems. In many cases, a health care provider will diagnose diverticulosis while using a flexible scope to examine the colon (colonoscopy). TREATMENT  If you have never developed an infection related to diverticulosis, you may not need treatment. If you have had an infection before, treatment may include:  Eating more  fruits, vegetables, and grains.  Taking a fiber supplement.  Taking a live bacteria supplement (probiotic).  Taking medicine to relax your colon. HOME CARE INSTRUCTIONS   Drink at least 6-8 glasses of water each day to prevent constipation.  Try not to strain when you have a bowel movement.  Keep all follow-up appointments. If you have had an infection before:  Increase the fiber in your diet as directed by your health care provider or dietitian.  Take a dietary fiber supplement if your health care provider approves.  Only take medicines as directed by your health care provider. SEEK MEDICAL CARE IF:   You have abdominal pain.  You have bloating.  You have cramps.  You have not gone to the bathroom in 3 days. SEEK IMMEDIATE MEDICAL CARE IF:   Your pain gets worse.  Yourbloating becomes very bad.  You have a fever or chills, and your symptoms suddenly get worse.  You begin vomiting.  You have bowel movements that are bloody or black. MAKE SURE YOU:  Understand these instructions.  Will watch your condition.  Will get help right away if you are not doing well or get worse. Document Released: 12/26/2003 Document Revised: 04/04/2013 Document Reviewed: 02/22/2013 [facility] Patient Information 2015 [place], [place]. This information is not intended to replace advice given to you by your health care provider. Make sure you discuss any questions you have with your health care provider. Colon Polyps Polyps are lumps of extra tissue growing inside the body. Polyps can grow in the large intestine (colon). Most colon polyps are noncancerous (benign). However, some colon polyps can become cancerous over time. Polyps that are larger than a pea may be harmful. To be safe, caregivers remove and test all polyps. CAUSES  Polyps form when mutations in the genes cause your cells to grow and divide even though no more tissue is needed. RISK FACTORS There are a number of risk factors  that can increase your chances of getting colon polyps. They include:  Being older than 50 years.  Family history of colon polyps or colon cancer.  Long-term colon diseases, such as colitis or Crohn disease.  Being overweight.  Smoking.  Being inactive.  Drinking too much alcohol. SYMPTOMS  Most small polyps do not cause symptoms. If symptoms are present, they may include:  Blood in the stool. The stool may look dark red or black.  Constipation or diarrhea that lasts longer than 1 week. DIAGNOSIS People often do not know they have polyps until their caregiver finds them during a regular checkup. Your caregiver can use 4 tests to check for polyps:  Digital rectal exam. The caregiver wears gloves and feels inside the rectum. This test would find polyps only in the rectum.  Barium enema. The caregiver puts a liquid called barium into your rectum before taking X-rays of your colon. Barium makes your colon look white. Polyps are dark, so they are easy to see in the X-ray pictures.  Sigmoidoscopy. A thin, flexible tube (sigmoidoscope) is placed into your rectum. The sigmoidoscope has a light  and tiny camera in it. The caregiver uses the sigmoidoscope to look at the last third of your colon.  Colonoscopy. This test is like sigmoidoscopy, but the caregiver looks at the entire colon. This is the most common method for finding and removing polyps. TREATMENT  Any polyps will be removed during a sigmoidoscopy or colonoscopy. The polyps are then tested for cancer. PREVENTION  To help lower your risk of getting more colon polyps:  Eat plenty of fruits and vegetables. Avoid eating fatty foods.  Do not smoke.  Avoid drinking alcohol.  Exercise every day.  Lose weight if recommended by your caregiver.  Eat plenty of calcium and folate. Foods that are rich in calcium include milk, cheese, and broccoli. Foods that are rich in folate include chickpeas, kidney beans, and spinach. HOME CARE  INSTRUCTIONS Keep all follow-up appointments as directed by your caregiver. You may need periodic exams to check for polyps. SEEK MEDICAL CARE IF: You notice bleeding during a bowel movement. Document Released: 12/25/2003 Document Revised: 06/22/2011 Document Reviewed: 06/09/2011 [facility] Patient Information 2015 [place], [place]. This information is not intended to replace advice given to you by your health care provider. Make sure you discuss any questions you have with your health care provider.

## 2014-10-10 NOTE — Op Note (Signed)
[facility] [address] [place], [address]   COLONOSCOPY PROCEDURE REPORT  PATIENT: Gordon Bell, Gordon Bell  MR#: [id number] BIRTHDATE: Nov 14, 1945 , 52  yrs. old GENDER: male ENDOSCOPIST: R.  Garfield Cornea, MD FACP [facility] REFERRED [id number] Hilma Favors, M.D. PROCEDURE DATE:  14-Oct-2014 PROCEDURE:   Colonoscopy with biopsy and snare polypectomy INDICATIONS:Personal history of colon polyps and positive family history of colon cancer. MEDICATIONS: Versed 3 mg IV and Demerol 50 mg IV in divided doses. Phenergan 25 mg IV.  Zofran 4 mg IV. ASA CLASS:       Class II  CONSENT: The risks, benefits, alternatives and imponderables including but not limited to bleeding, perforation as well as the possibility of a missed lesion have been reviewed.  The potential for biopsy, lesion removal, etc. have also been discussed. Questions have been answered.  All parties agreeable.  Please see the history and physical in the medical record for more information.  DESCRIPTION OF PROCEDURE:   After the risks benefits and alternatives of the procedure were thoroughly explained, informed consent was obtained.  The digital rectal exam revealed no abnormalities of the rectum.   The [id number]  endoscope was introduced through the anus and advanced to the cecum, which was identified by both the appendix and ileocecal valve. No adverse events experienced.   The quality of the prep was adequate  The instrument was then slowly withdrawn as the colon was fully examined. Estimated blood loss is zero unless otherwise noted in this procedure report.      COLON FINDINGS: (1) 5 mm polyp in the rectum at 5 cm from the anal verge; otherwise, the remainder of the rectal mucosa appeared normal.  Scattered left-sided diverticula; (1) diminutive polyp in the base of the cecum; otherwise, the remainder of the colonic mucosa appeared normal.  The above-mentioned polyps were removed with cold snare and  biopsy technique, respectively.  Retroflexion was performed. .  Withdrawal time=9 minutes 0 seconds.  The scope was withdrawn and the procedure completed. COMPLICATIONS: There were no immediate complications.  ENDOSCOPIC IMPRESSION: Rectal and colonic polyps?"removed as described above. Colonic diverticulosis.  RECOMMENDATIONS: Follow-up on pathology.  eSigned:  R. Garfield Cornea, MD Rosalita Chessman [facility] 2014/10/14 2:48 PM   cc:  CPT CODES: ICD CODES:  The ICD and CPT codes recommended by this software are interpretations from the data that the clinical staff has captured with the software.  The verification of the translation of this report to the ICD and CPT codes and modifiers is the sole responsibility of the health care institution and practicing physician where this report was generated.  [employer] will not be held responsible for the validity of the ICD and CPT codes included on this report.  AMA assumes no liability for data contained or not contained herein. CPT is a Designer, television/film set of the [employer].  PATIENT NAME:  Gordon Bell, Gordon Bell MR#: [id number]

## 2014-10-12 ENCOUNTER — Encounter (HOSPITAL_COMMUNITY): Payer: Self-pay | Admitting: Internal Medicine

## 2014-10-15 ENCOUNTER — Encounter: Payer: Self-pay | Admitting: Internal Medicine

## 2014-12-14 ENCOUNTER — Encounter: Payer: Self-pay | Admitting: Nurse Practitioner

## 2014-12-14 ENCOUNTER — Ambulatory Visit (INDEPENDENT_AMBULATORY_CARE_PROVIDER_SITE_OTHER): Payer: Medicare Other | Admitting: Nurse Practitioner

## 2014-12-14 DIAGNOSIS — R198 Other specified symptoms and signs involving the digestive system and abdomen: Secondary | ICD-10-CM | POA: Diagnosis not present

## 2014-12-14 DIAGNOSIS — Z9189 Other specified personal risk factors, not elsewhere classified: Secondary | ICD-10-CM

## 2014-12-14 NOTE — Patient Instructions (Signed)
1. Follow a low-fat diet. 2. Try to get plenty of exercise. 3. Follow-up as needed for any GI symptoms. 4. Have a repeat colonoscopy in 5 years.

## 2014-12-14 NOTE — Assessment & Plan Note (Signed)
Symptoms resolved. Patient thinks he might been constipated at that time. Constipation is currently well-controlled. He'll take prunes when he becomes constipated and is typically relieved his symptoms. Follow-up as needed.

## 2014-12-14 NOTE — Progress Notes (Signed)
Referring Provider: Sharilyn Sites, MD Primary Care Physician:  Purvis Kilts, MD Primary GI:  Dr. Gala Romney  Chief Complaint  Patient presents with  . Follow-up    Doing well    HPI:   69 year old male presents for follow-up on procedure for screening of colon cancer and abdominal fullness. Colonoscopy completed 10/10/2014 with conscious sedation found 5 mm polyp in the rectum, 1 diminutive polyp in the base of cecum, otherwise colonic and rectal mucosa normal. Pathology found both polyps be tubular adenoma. Recommend 5 year repeat colonoscopy for surveillance (2021). At last visit PCP note included hepatic steatosis. Patient is hypertensive, hypercholesterolemic, and obese. Labs checked after last visit including CBC and CMP showed normal blood cell count and CMP with a minimal elevation in AST (39) is. Other liver function tests normal.  Today he states he's doing well overall. Denies abdominal pain, N/V, change in bowel habits, unexplained fevers, unintentional weight loss. Occasional constipation which resolves well with eating prunes. Denies hematochezia and melena. Denies chest pain, dyspnea, dizziness, lightheadedness, syncope, near syncope. Denies any other upper or lower GI symptoms.  Past Medical History  Diagnosis Date  . HTN (hypertension)   . Hypercholesterolemia   . S/P colonoscopy 2005    Past Surgical History  Procedure Laterality Date  . Colonoscopy  2005  . Colonoscopy N/A 10/10/2014    Rectal and colonic polyps removed as described above. colonic diverticulosis    Current Outpatient Prescriptions  Medication Sig Dispense Refill  . aspirin 81 MG tablet Take 81 mg by mouth daily.      . fish oil-omega-3 fatty acids 1000 MG capsule Take 2 g by mouth daily.      Marland Kitchen lisinopril (PRINIVIL,ZESTRIL) 10 MG tablet Take 10 mg by mouth daily.   11  . lovastatin (MEVACOR) 20 MG tablet Take 20 mg by mouth daily at 6 PM.   11  . niacin (NIASPAN) 500 MG CR tablet Take 500  mg by mouth at bedtime.    . polyethylene glycol-electrolytes (NULYTELY/GOLYTELY) 420 G solution Take 4,000 mLs by mouth once. (Patient not taking: Reported on 12/14/2014) 4000 mL 0   No current facility-administered medications for this visit.    Allergies as of 12/14/2014  . (No Known Allergies)    Family History  Problem Relation Age of Onset  . Diabetes Mother     deceased  . Colon cancer Brother 58  . Colon cancer Brother 65    Social History   Social History  . Marital Status: Married    Spouse Name: N/A  . Number of Children: N/A  . Years of Education: N/A   Social History Main Topics  . Smoking status: Former Smoker    Quit date: 09/11/1984  . Smokeless tobacco: Current User    Types: Chew     Comment: Quit x 30 years  . Alcohol Use: 8.4 oz/week    14 Cans of beer per week     Comment: 2-3 beers per day  . Drug Use: No  . Sexual Activity:    Partners: Female    Museum/gallery curator: None     Comment: spouse   Other Topics Concern  . None   Social History Narrative    Review of Systems: General: Negative for anorexia, weight loss, fever, chills, fatigue, weakness. CV: Negative for chest pain, angina, palpitations, dyspnea on exertion, peripheral edema.  Respiratory: Negative for dyspnea at rest, dyspnea on exertion, cough, sputum, wheezing.  GI: See history of  present illness. Endo: Negative for unusual weight change.    Physical Exam: BP 136/68 mmHg  Pulse 65  Temp(Src) 97.3 F (36.3 C) (Oral)  Ht 5\' 9"  (1.753 m)  Wt 151 lb 9.6 oz (68.765 kg)  BMI 22.38 kg/m2 General:   Alert and oriented. Pleasant and cooperative. Well-nourished and well-developed.  Eyes:  Without icterus, sclera clear and conjunctiva pink.  Cardiovascular:  S1, S2 present without murmurs appreciated. Normal pulses noted. Extremities without clubbing or edema. Respiratory:  Clear to auscultation bilaterally. No wheezes, rales, or rhonchi. No distress.  Gastrointestinal:   +BS, soft, non-tender and non-distended. No HSM noted. No guarding or rebound. No masses appreciated.  Rectal:  Deferred  Psych:  Alert and cooperative. Normal mood and affect. Heme/Lymph/Immune: No excessive bruising noted.    12/14/2014 10:59 AM

## 2014-12-14 NOTE — Progress Notes (Signed)
cc'ed to pcp °

## 2014-12-14 NOTE — Assessment & Plan Note (Signed)
Patient with high risk of colon cancer due to family history including 2 brothers for colon cancer. His last colonoscopy had 2 polyps both of which were tubular adenoma. Otherwise rectal mucosa normal. Recommended 5 year repeat follow-up, patient is artery placed on the recall list for this. Explained the importance of colon cancer screening given his high risk category return for follow-up as needed, at least in 5 years for repeat exam.

## 2015-07-31 DIAGNOSIS — Z85828 Personal history of other malignant neoplasm of skin: Secondary | ICD-10-CM | POA: Diagnosis not present

## 2015-07-31 DIAGNOSIS — L570 Actinic keratosis: Secondary | ICD-10-CM | POA: Diagnosis not present

## 2015-07-31 DIAGNOSIS — C44329 Squamous cell carcinoma of skin of other parts of face: Secondary | ICD-10-CM | POA: Diagnosis not present

## 2015-07-31 DIAGNOSIS — D485 Neoplasm of uncertain behavior of skin: Secondary | ICD-10-CM | POA: Diagnosis not present

## 2015-08-29 DIAGNOSIS — C44329 Squamous cell carcinoma of skin of other parts of face: Secondary | ICD-10-CM | POA: Diagnosis not present

## 2015-10-16 ENCOUNTER — Emergency Department (HOSPITAL_COMMUNITY)
Admission: EM | Admit: 2015-10-16 | Discharge: 2015-10-16 | Disposition: A | Discharge status: Home / Self Care | Payer: PPO | Attending: Emergency Medicine | Admitting: Emergency Medicine

## 2015-10-16 ENCOUNTER — Encounter (HOSPITAL_COMMUNITY): Payer: Self-pay | Admitting: Emergency Medicine

## 2015-10-16 DIAGNOSIS — Y9389 Activity, other specified: Secondary | ICD-10-CM | POA: Diagnosis not present

## 2015-10-16 DIAGNOSIS — Z7982 Long term (current) use of aspirin: Secondary | ICD-10-CM | POA: Diagnosis not present

## 2015-10-16 DIAGNOSIS — E7800 Pure hypercholesterolemia, unspecified: Secondary | ICD-10-CM | POA: Insufficient documentation

## 2015-10-16 DIAGNOSIS — I10 Essential (primary) hypertension: Secondary | ICD-10-CM | POA: Diagnosis not present

## 2015-10-16 DIAGNOSIS — Y929 Unspecified place or not applicable: Secondary | ICD-10-CM | POA: Insufficient documentation

## 2015-10-16 DIAGNOSIS — S0003XA Contusion of scalp, initial encounter: Secondary | ICD-10-CM | POA: Insufficient documentation

## 2015-10-16 DIAGNOSIS — Z87891 Personal history of nicotine dependence: Secondary | ICD-10-CM | POA: Insufficient documentation

## 2015-10-16 DIAGNOSIS — Y999 Unspecified external cause status: Secondary | ICD-10-CM | POA: Insufficient documentation

## 2015-10-16 DIAGNOSIS — S098XXA Other specified injuries of head, initial encounter: Secondary | ICD-10-CM | POA: Diagnosis not present

## 2015-10-16 DIAGNOSIS — S0990XA Unspecified injury of head, initial encounter: Secondary | ICD-10-CM | POA: Diagnosis not present

## 2015-10-16 DIAGNOSIS — W208XXA Other cause of strike by thrown, projected or falling object, initial encounter: Secondary | ICD-10-CM | POA: Diagnosis not present

## 2015-10-16 NOTE — ED Notes (Signed)
Pt states he propped a pole saw against a tree and it fell striking his head. Pt has knot to the left side of head. Pt denies any loc.

## 2015-10-16 NOTE — ED Provider Notes (Signed)
CSN: [id number]     Arrival date & time 10/16/15  2023 History   First MD Initiated Contact with Patient 10/16/15 2046     Chief Complaint  Patient presents with  . Head Injury    Patient is a 70 y.o. male presenting with head injury.  Head Injury Associated symptoms: no headaches, no nausea, no numbness and no vomiting   Patient states he was trimming some trees with a pole tremor when it slipped off the side and hit him on this head. No loss conscious. No headache. No confusion. He is not on anticoagulation. No numbness or weakness. Hematoma left side of the head and he thought he needed to get checked out.  Past Medical History  Diagnosis Date  . HTN (hypertension)   . Hypercholesterolemia   . S/P colonoscopy 2005   Past Surgical History  Procedure Laterality Date  . Colonoscopy  2005  . Colonoscopy N/A 10/10/2014    Rectal and colonic polyps removed as described above. colonic diverticulosis   Family History  Problem Relation Age of Onset  . Diabetes Mother     deceased  . Colon cancer Brother 78  . Colon cancer Brother 85   Social History  Substance Use Topics  . Smoking status: Former Smoker    Quit date: 09/11/1984  . Smokeless tobacco: Current User    Types: Chew     Comment: Quit x 30 years  . Alcohol Use: 8.4 oz/week    14 Cans of beer per week     Comment: 2-3 beers per day    Review of Systems  Constitutional: Negative for activity change and appetite change.  Eyes: Negative for pain.  Respiratory: Negative for chest tightness and shortness of breath.   Cardiovascular: Negative for chest pain and leg swelling.  Gastrointestinal: Negative for nausea, vomiting, abdominal pain and diarrhea.  Genitourinary: Negative for flank pain.  Musculoskeletal: Negative for back pain and neck stiffness.  Skin: Negative for rash.  Neurological: Negative for syncope, weakness, numbness and headaches.  Psychiatric/Behavioral: Negative for behavioral problems.       Allergies  Review of patient's allergies indicates no known allergies.  Home Medications   Prior to Admission medications   Medication Sig Start Date End Date Taking? Authorizing Provider  aspirin 81 MG tablet Take 81 mg by mouth daily.      Historical Provider, MD  fish oil-omega-3 fatty acids 1000 MG capsule Take 2 g by mouth daily.      Historical Provider, MD  lisinopril (PRINIVIL,ZESTRIL) 10 MG tablet Take 10 mg by mouth daily.  07/16/14   Historical Provider, MD  lovastatin (MEVACOR) 20 MG tablet Take 20 mg by mouth daily at 6 PM.  07/16/14   Historical Provider, MD  niacin (NIASPAN) 500 MG CR tablet Take 500 mg by mouth at bedtime.    Historical Provider, MD  polyethylene glycol-electrolytes (NULYTELY/GOLYTELY) 420 G solution Take 4,000 mLs by mouth once. Patient not taking: Reported on 12/14/2014 09/12/14   Carlis Stable, NP   BP 130/72 mmHg  Pulse 98  Temp(Src) 98.2 F (36.8 C) (Oral)  Resp 16  Ht 5\' 9"  (1.753 m)  Wt 166 lb (75.297 kg)  BMI 24.50 kg/m2  SpO2 95% Physical Exam  Constitutional: He appears well-developed.  HENT:  A small raised hematoma to left superior parietal area. No apparent underlying bony tenderness. No step-off deformity.  Eyes: EOM are normal.  Neck: Neck supple.  Cardiovascular: Normal rate.   Pulmonary/Chest: Effort normal.  Musculoskeletal: Normal range of motion.  Neurological: He is alert.  Skin: Skin is warm.    ED Course  Procedures (including critical care time) Labs Review Labs Reviewed - No data to display  Imaging Review No results found. I have personally reviewed and evaluated these images and lab results as part of my medical decision-making.   EKG Interpretation None      MDM   Final diagnoses:  Minor head injury, initial encounter  Scalp hematoma, initial encounter    Patient with mild head injury. Doubt intracranial pathology. Happened around 2 hours prior to my examination. Does not appear to the CT scan at  this time. Discussed with patient's family members and patient. Will discharge home.    Davonna Belling, MD 10/16/15 [phone]

## 2015-10-16 NOTE — Discharge Instructions (Signed)
Facial or Scalp Contusion A facial or scalp contusion is a deep bruise on the face or head. Injuries to the face and head generally cause a lot of swelling, especially around the eyes. Contusions are the result of an injury that caused bleeding under the skin. The contusion may turn blue, purple, or yellow. Minor injuries will give you a painless contusion, but more severe contusions may stay painful and swollen for a few weeks.  CAUSES  A facial or scalp contusion is caused by a blunt injury or trauma to the face or head area.  SIGNS AND SYMPTOMS   Swelling of the injured area.   Discoloration of the injured area.   Tenderness, soreness, or pain in the injured area.  DIAGNOSIS  The diagnosis can be made by taking a medical history and doing a physical exam. An X-ray exam, CT scan, or MRI may be needed to determine if there are any associated injuries, such as broken bones (fractures). TREATMENT  Often, the best treatment for a facial or scalp contusion is applying cold compresses to the injured area. Over-the-counter medicines may also be recommended for pain control.  HOME CARE INSTRUCTIONS   Only take over-the-counter or prescription medicines as directed by your health care provider.   Apply ice to the injured area.   Put ice in a plastic bag.   Place a towel between your skin and the bag.   Leave the ice on for 20 minutes, 2-3 times a day.  SEEK MEDICAL CARE IF:  You have bite problems.   You have pain with chewing.   You are concerned about facial defects. SEEK IMMEDIATE MEDICAL CARE IF:  You have severe pain or a headache that is not relieved by medicine.   You have unusual sleepiness, confusion, or personality changes.   You throw up (vomit).   You have a persistent nosebleed.   You have double vision or blurred vision.   You have fluid drainage from your nose or ear.   You have difficulty walking or using your arms or legs.  MAKE SURE YOU:    Understand these instructions.  Will watch your condition.  Will get help right away if you are not doing well or get worse.   This information is not intended to replace advice given to you by your health care provider. Make sure you discuss any questions you have with your health care provider.   Document Released: 05/07/2004 Document Revised: 04/20/2014 Document Reviewed: 11/10/2012 Elsevier Interactive Patient Education 2016 [place] Injury, Adult You have a head injury. Headaches and throwing up (vomiting) are common after a head injury. It should be easy to wake up from sleeping. Sometimes you must stay in the hospital. Most problems happen within the first 24 hours. Side effects may occur up to 7-10 days after the injury.  WHAT ARE THE TYPES OF HEAD INJURIES? Head injuries can be as minor as a bump. Some head injuries can be more severe. More severe head injuries include:  A jarring injury to the brain (concussion).  A bruise of the brain (contusion). This mean there is bleeding in the brain that can cause swelling.  A cracked skull (skull fracture).  Bleeding in the brain that collects, clots, and forms a bump (hematoma). WHEN SHOULD I GET HELP RIGHT AWAY?   You are confused or sleepy.  You cannot be woken up.  You feel sick to your stomach (nauseous) or keep throwing up (vomiting).  Your dizziness or  unsteadiness is getting worse.  You have very bad, lasting headaches that are not helped by medicine. Take medicines only as told by your doctor.  You cannot use your arms or legs like normal.  You cannot walk.  You notice changes in the black spots in the center of the colored part of your eye (pupil).  You have clear or bloody fluid coming from your nose or ears.  You have trouble seeing. During the next 24 hours after the injury, you must stay with someone who can watch you. This person should get help right away (call 911 in the [place] if you start  to shake and are not able to control it (have seizures), you pass out, or you are unable to wake up. HOW CAN I PREVENT A HEAD INJURY IN THE FUTURE?  Wear seat belts.  Wear a helmet while bike riding and playing sports like football.  Stay away from dangerous activities around the house. WHEN CAN I RETURN TO NORMAL ACTIVITIES AND ATHLETICS? See your doctor before doing these activities. You should not do normal activities or play contact sports until 1 week after the following symptoms have stopped:  Headache that does not go away.  Dizziness.  Poor attention.  Confusion.  Memory problems.  Sickness to your stomach or throwing up.  Tiredness.  Fussiness.  Bothered by bright lights or loud noises.  Anxiousness or depression.  Restless sleep. MAKE SURE YOU:   Understand these instructions.  Will watch your condition.  Will get help right away if you are not doing well or get worse.   This information is not intended to replace advice given to you by your health care provider. Make sure you discuss any questions you have with your health care provider.   Document Released: 03/12/2008 Document Revised: 04/20/2014 Document Reviewed: 12/05/2012 Elsevier Interactive Patient Education [employer].

## 2016-02-11 DIAGNOSIS — L570 Actinic keratosis: Secondary | ICD-10-CM | POA: Diagnosis not present

## 2016-02-11 DIAGNOSIS — L719 Rosacea, unspecified: Secondary | ICD-10-CM | POA: Diagnosis not present

## 2016-02-11 DIAGNOSIS — Z85828 Personal history of other malignant neoplasm of skin: Secondary | ICD-10-CM | POA: Diagnosis not present

## 2016-02-12 DIAGNOSIS — Z6824 Body mass index (BMI) 24.0-24.9, adult: Secondary | ICD-10-CM | POA: Diagnosis not present

## 2016-02-12 DIAGNOSIS — R7301 Impaired fasting glucose: Secondary | ICD-10-CM | POA: Diagnosis not present

## 2016-02-12 DIAGNOSIS — I10 Essential (primary) hypertension: Secondary | ICD-10-CM | POA: Diagnosis not present

## 2016-02-12 DIAGNOSIS — Z23 Encounter for immunization: Secondary | ICD-10-CM | POA: Diagnosis not present

## 2016-02-12 DIAGNOSIS — Z0001 Encounter for general adult medical examination with abnormal findings: Secondary | ICD-10-CM | POA: Diagnosis not present

## 2016-02-12 DIAGNOSIS — Z125 Encounter for screening for malignant neoplasm of prostate: Secondary | ICD-10-CM | POA: Diagnosis not present

## 2016-02-12 DIAGNOSIS — Z1389 Encounter for screening for other disorder: Secondary | ICD-10-CM | POA: Diagnosis not present

## 2016-08-10 DIAGNOSIS — L719 Rosacea, unspecified: Secondary | ICD-10-CM | POA: Diagnosis not present

## 2016-08-10 DIAGNOSIS — Z85828 Personal history of other malignant neoplasm of skin: Secondary | ICD-10-CM | POA: Diagnosis not present

## 2016-08-10 DIAGNOSIS — L570 Actinic keratosis: Secondary | ICD-10-CM | POA: Diagnosis not present

## 2016-12-17 DIAGNOSIS — C44309 Unspecified malignant neoplasm of skin of other parts of face: Secondary | ICD-10-CM | POA: Diagnosis not present

## 2016-12-17 DIAGNOSIS — Z1389 Encounter for screening for other disorder: Secondary | ICD-10-CM | POA: Diagnosis not present

## 2016-12-17 DIAGNOSIS — E782 Mixed hyperlipidemia: Secondary | ICD-10-CM | POA: Diagnosis not present

## 2016-12-17 DIAGNOSIS — R7309 Other abnormal glucose: Secondary | ICD-10-CM | POA: Diagnosis not present

## 2016-12-17 DIAGNOSIS — Z6824 Body mass index (BMI) 24.0-24.9, adult: Secondary | ICD-10-CM | POA: Diagnosis not present

## 2016-12-17 DIAGNOSIS — I10 Essential (primary) hypertension: Secondary | ICD-10-CM | POA: Diagnosis not present

## 2017-02-09 DIAGNOSIS — Z85828 Personal history of other malignant neoplasm of skin: Secondary | ICD-10-CM | POA: Diagnosis not present

## 2017-02-09 DIAGNOSIS — L719 Rosacea, unspecified: Secondary | ICD-10-CM | POA: Diagnosis not present

## 2017-02-09 DIAGNOSIS — L570 Actinic keratosis: Secondary | ICD-10-CM | POA: Diagnosis not present

## 2017-02-15 DIAGNOSIS — Z6824 Body mass index (BMI) 24.0-24.9, adult: Secondary | ICD-10-CM | POA: Diagnosis not present

## 2017-02-15 DIAGNOSIS — Z23 Encounter for immunization: Secondary | ICD-10-CM | POA: Diagnosis not present

## 2017-02-15 DIAGNOSIS — Z1389 Encounter for screening for other disorder: Secondary | ICD-10-CM | POA: Diagnosis not present

## 2017-02-15 DIAGNOSIS — Z0001 Encounter for general adult medical examination with abnormal findings: Secondary | ICD-10-CM | POA: Diagnosis not present

## 2017-08-10 DIAGNOSIS — L0390 Cellulitis, unspecified: Secondary | ICD-10-CM | POA: Diagnosis not present

## 2017-08-10 DIAGNOSIS — L0100 Impetigo, unspecified: Secondary | ICD-10-CM | POA: Diagnosis not present

## 2017-08-10 DIAGNOSIS — L719 Rosacea, unspecified: Secondary | ICD-10-CM | POA: Diagnosis not present

## 2017-08-10 DIAGNOSIS — L570 Actinic keratosis: Secondary | ICD-10-CM | POA: Diagnosis not present

## 2017-11-27 ENCOUNTER — Emergency Department (HOSPITAL_COMMUNITY)
Admission: EM | Admit: 2017-11-27 | Discharge: 2017-11-27 | Disposition: A | Discharge status: Home / Self Care | Payer: PPO | Attending: Emergency Medicine | Admitting: Emergency Medicine

## 2017-11-27 ENCOUNTER — Encounter (HOSPITAL_COMMUNITY): Payer: Self-pay | Admitting: Emergency Medicine

## 2017-11-27 ENCOUNTER — Other Ambulatory Visit: Payer: Self-pay

## 2017-11-27 DIAGNOSIS — Z87891 Personal history of nicotine dependence: Secondary | ICD-10-CM | POA: Diagnosis not present

## 2017-11-27 DIAGNOSIS — Y929 Unspecified place or not applicable: Secondary | ICD-10-CM | POA: Diagnosis not present

## 2017-11-27 DIAGNOSIS — S61211A Laceration without foreign body of left index finger without damage to nail, initial encounter: Secondary | ICD-10-CM | POA: Diagnosis not present

## 2017-11-27 DIAGNOSIS — Z7982 Long term (current) use of aspirin: Secondary | ICD-10-CM | POA: Insufficient documentation

## 2017-11-27 DIAGNOSIS — Y9389 Activity, other specified: Secondary | ICD-10-CM | POA: Insufficient documentation

## 2017-11-27 DIAGNOSIS — S61213A Laceration without foreign body of left middle finger without damage to nail, initial encounter: Secondary | ICD-10-CM

## 2017-11-27 DIAGNOSIS — I10 Essential (primary) hypertension: Secondary | ICD-10-CM | POA: Diagnosis not present

## 2017-11-27 DIAGNOSIS — Y999 Unspecified external cause status: Secondary | ICD-10-CM | POA: Insufficient documentation

## 2017-11-27 DIAGNOSIS — W260XXA Contact with knife, initial encounter: Secondary | ICD-10-CM | POA: Diagnosis not present

## 2017-11-27 DIAGNOSIS — Z23 Encounter for immunization: Secondary | ICD-10-CM | POA: Insufficient documentation

## 2017-11-27 DIAGNOSIS — S6992XA Unspecified injury of left wrist, hand and finger(s), initial encounter: Secondary | ICD-10-CM | POA: Diagnosis present

## 2017-11-27 DIAGNOSIS — Z79899 Other long term (current) drug therapy: Secondary | ICD-10-CM | POA: Diagnosis not present

## 2017-11-27 MED ORDER — BACITRACIN-NEOMYCIN-POLYMYXIN 400-5-5000 EX OINT
TOPICAL_OINTMENT | Freq: Once | CUTANEOUS | Status: AC
  Administered 2017-11-27: 1 via TOPICAL
  Filled 2017-11-27: qty 1

## 2017-11-27 MED ORDER — CEPHALEXIN 500 MG PO CAPS
500.0000 mg | ORAL_CAPSULE | Freq: Once | ORAL | Status: AC
  Administered 2017-11-27: 500 mg via ORAL
  Filled 2017-11-27: qty 1

## 2017-11-27 MED ORDER — CEPHALEXIN 500 MG PO CAPS: 500.0000 mg | ORAL_CAPSULE | Freq: Three times a day (TID) | ORAL | 0 refills | Status: AC

## 2017-11-27 MED ORDER — ACETAMINOPHEN 500 MG PO TABS
1000.0000 mg | ORAL_TABLET | Freq: Once | ORAL | Status: AC
  Administered 2017-11-27: 1000 mg via ORAL
  Filled 2017-11-27: qty 2

## 2017-11-27 MED ORDER — TETANUS-DIPHTH-ACELL PERTUSSIS 5-2.5-18.5 LF-MCG/0.5 IM SUSP
0.5000 mL | Freq: Once | INTRAMUSCULAR | Status: AC
  Administered 2017-11-27: 0.5 mL via INTRAMUSCULAR
  Filled 2017-11-27: qty 0.5

## 2017-11-27 NOTE — ED Provider Notes (Signed)
[facility] EMERGENCY DEPARTMENT Provider Note   CSN: [id number] Arrival date & time: 11/27/17  1005     History   Chief Complaint Chief Complaint  Patient presents with  . Laceration    HPI Gordon Bell is a 72 y.o. male.  Patient is a 72 year old male who presents to the emergency department with lacerations to the left hand.  The patient states that he was trying to undo a screw with his pocket knife, the knife slipped and he cut his left index finger and also his middle finger on the left hand.  This occurred on yesterday August 16.  The patient states he had some throbbing of his fingers last night, and his family encouraged him to come to the emergency department to get a tetanus shot and to check on infection.  The patient denies a history of any methicillin-resistant staph.  He is not on any anticoagulation medications with the exception of 81 mg aspirin.  The history is provided by the patient.    Past Medical History:  Diagnosis Date  . HTN (hypertension)   . Hypercholesterolemia   . S/P colonoscopy 2005    Patient Active Problem List   Diagnosis Date Noted  . History of colonic polyps   . Diverticulosis of colon without hemorrhage   . Abdominal fullness 09/12/2014  . High risk for colon cancer 09/12/2014  . Pre-op testing 08/07/2010  . Abdominal pain, left lower quadrant 08/07/2010    Past Surgical History:  Procedure Laterality Date  . COLONOSCOPY  2005  . COLONOSCOPY N/A 10/10/2014   Rectal and colonic polyps removed as described above. colonic diverticulosis        Home Medications    Prior to Admission medications   Medication Sig Start Date End Date Taking? Authorizing Provider  aspirin 81 MG tablet Take 81 mg by mouth daily.      [provider]  fish oil-omega-3 fatty acids 1000 MG capsule Take 2 g by mouth daily.      [provider]  lisinopril (PRINIVIL,ZESTRIL) 10 MG tablet Take 10 mg by mouth daily.  07/16/14    [provider]  lovastatin (MEVACOR) 20 MG tablet Take 20 mg by mouth daily at 6 PM.  07/16/14   [provider]  niacin (NIASPAN) 500 MG CR tablet Take 500 mg by mouth at bedtime.    [provider]  polyethylene glycol-electrolytes (NULYTELY/GOLYTELY) 420 G solution Take 4,000 mLs by mouth once. Patient not taking: Reported on 12/14/2014 09/12/14   Carlis Stable, NP    Family History Family History  Problem Relation Age of Onset  . Diabetes Mother        deceased  . Colon cancer Brother 33  . Colon cancer Brother 33    Social History Social History   Tobacco Use  . Smoking status: Former Smoker    Last attempt to quit: 09/11/1984    Years since quitting: 33.2  . Smokeless tobacco: Current User    Types: Chew  . Tobacco comment: Quit x 30 years  Substance Use Topics  . Alcohol use: Yes    Alcohol/week: 14.0 standard drinks    Types: 14 Cans of beer per week    Comment: 2-3 beers per day  . Drug use: No     Allergies   Patient has no known allergies.   Review of Systems Review of Systems  Constitutional: Negative for activity change.       All ROS Neg  except as noted in HPI  HENT: Negative for nosebleeds.   Eyes: Negative for photophobia and discharge.  Respiratory: Negative for cough, shortness of breath and wheezing.   Cardiovascular: Negative for chest pain and palpitations.  Gastrointestinal: Negative for abdominal pain and blood in stool.  Genitourinary: Negative for dysuria, frequency and hematuria.  Musculoskeletal: Negative for arthralgias, back pain and neck pain.  Skin: Positive for wound.       Laceration fingers  Neurological: Negative for dizziness, seizures and speech difficulty.  Psychiatric/Behavioral: Negative for confusion and hallucinations.     Physical Exam Updated Vital Signs BP 140/63 (BP Location: Right Arm)   Pulse 62   Temp 97.8 F (36.6 C) (Oral)   Resp 18   Ht 5\' 9"  (1.753 m)   Wt 68 kg   SpO2 96%   BMI  22.15 kg/m   Physical Exam  Constitutional: He is oriented to person, place, and time. He appears well-developed and well-nourished.  Non-toxic appearance.  HENT:  Head: Normocephalic.  Right Ear: Tympanic membrane and external ear normal.  Left Ear: Tympanic membrane and external ear normal.  Eyes: Pupils are equal, round, and reactive to light. EOM and lids are normal.  Neck: Normal range of motion. Neck supple. Carotid bruit is not present.  Cardiovascular: Normal rate, regular rhythm, normal heart sounds, intact distal pulses and normal pulses.  Pulmonary/Chest: Breath sounds normal. No respiratory distress.  Abdominal: Soft. Bowel sounds are normal. There is no tenderness. There is no guarding.  Musculoskeletal: Normal range of motion.       Hands: Laceration to the left index finger and MP area of the middle finger.  There is no joint involvement.  There is no bone involvement.  There is no tendon involvement.  Lymphadenopathy:       Head (right side): No submandibular adenopathy present.       Head (left side): No submandibular adenopathy present.    He has no cervical adenopathy.  Neurological: He is alert and oriented to person, place, and time. He has normal strength. No cranial nerve deficit or sensory deficit.  Skin: Skin is warm and dry.  Psychiatric: He has a normal mood and affect. His speech is normal.  Nursing note and vitals reviewed.    ED Treatments / Results  Labs (all labs ordered are listed, but only abnormal results are displayed) Labs Reviewed - No data to display  EKG None  Radiology No results found.  Procedures Procedures (including critical care time)  Medications Ordered in ED Medications - No data to display   Initial Impression / Assessment and Plan / ED Course  I have reviewed the triage vital signs and the nursing notes.  Pertinent labs & imaging results that were available during my care of the patient were reviewed by me and  considered in my medical decision making (see chart for details).      Final Clinical Impressions(s) / ED Diagnoses MDM  Vital signs reviewed. The examination is negative for any joint, bone, or tendon involvement.  There is no neurologic deficit appreciated involving the left hand.   I have explained to the patient that the lacerations are not candidates for repair at this point due to the age of the lacerations.  The patient is unsure of the date of his last tetanus.  Tetanus status was updated today.  Patient will be placed on Keflex 500 mg.  The patient will use Tylenol extra strength for soreness.  I have asked the  patient to cleanse the wound daily with soap and water and apply clean bandage daily until the wounds have healed.  He will follow-up with the primary physician if any signs of advancing infection.  We discussed the need to return if any changes or problems in examination.   Final diagnoses:  Laceration of left index finger without foreign body without damage to nail, initial encounter  Laceration of left middle finger without foreign body without damage to nail, initial encounter    ED Discharge Orders         Ordered    cephALEXin (KEFLEX) 500 MG capsule  3 times daily     11/27/17 1114           Lily Kocher, PA-C 11/27/17 1122    Francine Graven, DO 11/28/17 1549

## 2017-11-27 NOTE — Discharge Instructions (Addendum)
Your vital signs within normal limits.  Your tetanus status was updated today.  Please use Keflex with breakfast lunch and dinner.  Please cleanse your wounds daily with soap and water and apply bandage until they have healed completely.  Please see Dr. Hilma Favors or return to the emergency department if any unusual redness of your fingers, red streaks going up your hand, pus like drainage from the lacerations, or signs of advancing infection.  Use Tylenol extra strength for soreness if needed.

## 2017-11-27 NOTE — ED Triage Notes (Addendum)
Patient has laceration to left index finger. Per patient cut finger on screw trying to put corn on squirrel feeder yesterday. Unsure of last tetanus vaccination. Dressing to finger clean, dry, and intact.

## 2017-12-22 ENCOUNTER — Emergency Department (HOSPITAL_COMMUNITY)
Admission: EM | Admit: 2017-12-22 | Discharge: 2017-12-22 | Disposition: A | Discharge status: Home / Self Care | Payer: PPO | Attending: Emergency Medicine | Admitting: Emergency Medicine

## 2017-12-22 ENCOUNTER — Encounter (HOSPITAL_COMMUNITY): Payer: Self-pay

## 2017-12-22 ENCOUNTER — Other Ambulatory Visit: Payer: Self-pay

## 2017-12-22 DIAGNOSIS — Y939 Activity, unspecified: Secondary | ICD-10-CM | POA: Diagnosis not present

## 2017-12-22 DIAGNOSIS — S80862A Insect bite (nonvenomous), left lower leg, initial encounter: Secondary | ICD-10-CM | POA: Diagnosis not present

## 2017-12-22 DIAGNOSIS — Y999 Unspecified external cause status: Secondary | ICD-10-CM | POA: Diagnosis not present

## 2017-12-22 DIAGNOSIS — Z87891 Personal history of nicotine dependence: Secondary | ICD-10-CM | POA: Insufficient documentation

## 2017-12-22 DIAGNOSIS — Z79899 Other long term (current) drug therapy: Secondary | ICD-10-CM | POA: Insufficient documentation

## 2017-12-22 DIAGNOSIS — W260XXA Contact with knife, initial encounter: Secondary | ICD-10-CM | POA: Insufficient documentation

## 2017-12-22 DIAGNOSIS — S80861A Insect bite (nonvenomous), right lower leg, initial encounter: Secondary | ICD-10-CM | POA: Insufficient documentation

## 2017-12-22 DIAGNOSIS — Z7982 Long term (current) use of aspirin: Secondary | ICD-10-CM | POA: Diagnosis not present

## 2017-12-22 DIAGNOSIS — S61211A Laceration without foreign body of left index finger without damage to nail, initial encounter: Secondary | ICD-10-CM | POA: Diagnosis not present

## 2017-12-22 DIAGNOSIS — I10 Essential (primary) hypertension: Secondary | ICD-10-CM | POA: Diagnosis not present

## 2017-12-22 DIAGNOSIS — W57XXXA Bitten or stung by nonvenomous insect and other nonvenomous arthropods, initial encounter: Secondary | ICD-10-CM | POA: Insufficient documentation

## 2017-12-22 DIAGNOSIS — Y929 Unspecified place or not applicable: Secondary | ICD-10-CM | POA: Diagnosis not present

## 2017-12-22 DIAGNOSIS — S80869A Insect bite (nonvenomous), unspecified lower leg, initial encounter: Secondary | ICD-10-CM

## 2017-12-22 NOTE — ED Triage Notes (Signed)
Pt wanting follow up for laceration on left index finger from 3 weeks ago. Says he noticed increased redness and swelling.   Also wants to get the bumps on his lower legs checked out. Noticed it this morning. Says might be fleas.

## 2017-12-22 NOTE — Discharge Instructions (Addendum)
Return if any problems.

## 2017-12-22 NOTE — ED Provider Notes (Signed)
[facility] EMERGENCY DEPARTMENT Provider Note   CSN: [id number] Arrival date & time: 12/22/17  3893     History   Chief Complaint Chief Complaint  Patient presents with  . Finger Injury    follow up  . Insect Bite    both feet    HPI Gordon Bell is a 72 y.o. male.  The history is provided by the patient. No language interpreter was used.  Wound Check  This is a new problem. Episode onset: 3 weeks ago. The problem occurs constantly. The problem has not changed since onset.Nothing aggravates the symptoms. Nothing relieves the symptoms. He has tried nothing for the symptoms. The treatment provided no relief.  Pt wants to get finger rechecked.  Pt cut finger 3 weeks ago.   Pt reports area is healing.  Pt also complains of a rash on feet and lower legs.  Past Medical History:  Diagnosis Date  . HTN (hypertension)   . Hypercholesterolemia   . S/P colonoscopy 2005    Patient Active Problem List   Diagnosis Date Noted  . History of colonic polyps   . Diverticulosis of colon without hemorrhage   . Abdominal fullness 09/12/2014  . High risk for colon cancer 09/12/2014  . Pre-op testing 08/07/2010  . Abdominal pain, left lower quadrant 08/07/2010    Past Surgical History:  Procedure Laterality Date  . COLONOSCOPY  2005  . COLONOSCOPY N/A 10/10/2014   Rectal and colonic polyps removed as described above. colonic diverticulosis        Home Medications    Prior to Admission medications   Medication Sig Start Date End Date Taking? Authorizing Provider  aspirin 81 MG tablet Take 81 mg by mouth daily.      [provider]  cephALEXin (KEFLEX) 500 MG capsule Take 1 capsule (500 mg total) by mouth 3 (three) times daily. 11/27/17   Lily Kocher, PA-C  fish oil-omega-3 fatty acids 1000 MG capsule Take 2 g by mouth daily.      [provider]  lisinopril (PRINIVIL,ZESTRIL) 10 MG tablet Take 10 mg by mouth daily.  07/16/14   [provider]    lovastatin (MEVACOR) 20 MG tablet Take 20 mg by mouth daily at 6 PM.  07/16/14   [provider]  niacin (NIASPAN) 500 MG CR tablet Take 500 mg by mouth at bedtime.    [provider]  polyethylene glycol-electrolytes (NULYTELY/GOLYTELY) 420 G solution Take 4,000 mLs by mouth once. Patient not taking: Reported on 12/14/2014 09/12/14   Carlis Stable, NP    Family History Family History  Problem Relation Age of Onset  . Diabetes Mother        deceased  . Colon cancer Brother 30  . Colon cancer Brother 34    Social History Social History   Tobacco Use  . Smoking status: Former Smoker    Last attempt to quit: 09/11/1984    Years since quitting: 33.3  . Smokeless tobacco: Current User    Types: Chew  . Tobacco comment: Quit x 30 years  Substance Use Topics  . Alcohol use: Yes    Alcohol/week: 14.0 standard drinks    Types: 14 Cans of beer per week    Comment: 2-3 beers per day  . Drug use: No     Allergies   Patient has no known allergies.   Review of Systems Review of Systems  Skin: Positive for wound.  All other systems reviewed and are negative.  Physical Exam Updated Vital Signs BP (!) 147/79 (BP Location: Right Arm)   Pulse 82   Temp 97.9 F (36.6 C) (Oral)   Resp 18   Ht 5\' 9"  (1.753 m)   Wt 68 kg   SpO2 100%   BMI 22.14 kg/m   Physical Exam  Constitutional: He appears well-developed and well-nourished.  HENT:  Head: Normocephalic.  Eyes: Pupils are equal, round, and reactive to light.  Musculoskeletal: Normal range of motion.  Multiple insect bites lower legs,    Finger, healing incision, no sign of infection  nv and ns intact  Neurological: He is alert.  Skin: Skin is warm.  Psychiatric: He has a normal mood and affect.  Nursing note and vitals reviewed.    ED Treatments / Results  Labs (all labs ordered are listed, but only abnormal results are displayed) Labs Reviewed - No data to display  EKG None  Radiology No  results found.  Procedures Procedures (including critical care time)  Medications Ordered in ED Medications - No data to display   Initial Impression / Assessment and Plan / ED Course  I have reviewed the triage vital signs and the nursing notes.  Pertinent labs & imaging results that were available during my care of the patient were reviewed by me and considered in my medical decision making (see chart for details).    MDM  Pt advised benadryl and hydrocortisone ointment    Final Clinical Impressions(s) / ED Diagnoses   Final diagnoses:  Insect bite of lower leg, unspecified laterality, initial encounter  Laceration of left index finger without damage to nail, foreign body presence unspecified, initial encounter    ED Discharge Orders    None    An After Visit Summary was printed and given to the patient.    Fransico Meadow, PA-C 12/22/17 0900    Francine Graven, DO 12/26/17 1333

## 2018-02-09 DIAGNOSIS — L570 Actinic keratosis: Secondary | ICD-10-CM | POA: Diagnosis not present

## 2018-02-16 DIAGNOSIS — Z23 Encounter for immunization: Secondary | ICD-10-CM | POA: Diagnosis not present

## 2018-02-16 DIAGNOSIS — Z0001 Encounter for general adult medical examination with abnormal findings: Secondary | ICD-10-CM | POA: Diagnosis not present

## 2018-02-16 DIAGNOSIS — I10 Essential (primary) hypertension: Secondary | ICD-10-CM | POA: Diagnosis not present

## 2018-02-16 DIAGNOSIS — Z6822 Body mass index (BMI) 22.0-22.9, adult: Secondary | ICD-10-CM | POA: Diagnosis not present

## 2018-02-16 DIAGNOSIS — C44309 Unspecified malignant neoplasm of skin of other parts of face: Secondary | ICD-10-CM | POA: Diagnosis not present

## 2018-02-16 DIAGNOSIS — Z1389 Encounter for screening for other disorder: Secondary | ICD-10-CM | POA: Diagnosis not present

## 2018-02-16 DIAGNOSIS — E782 Mixed hyperlipidemia: Secondary | ICD-10-CM | POA: Diagnosis not present

## 2018-05-24 DIAGNOSIS — Z1389 Encounter for screening for other disorder: Secondary | ICD-10-CM | POA: Diagnosis not present

## 2018-05-24 DIAGNOSIS — Z6822 Body mass index (BMI) 22.0-22.9, adult: Secondary | ICD-10-CM | POA: Diagnosis not present

## 2018-05-24 DIAGNOSIS — L089 Local infection of the skin and subcutaneous tissue, unspecified: Secondary | ICD-10-CM | POA: Diagnosis not present

## 2018-05-24 DIAGNOSIS — L299 Pruritus, unspecified: Secondary | ICD-10-CM | POA: Diagnosis not present

## 2018-07-25 DIAGNOSIS — Z992 Dependence on renal dialysis: Secondary | ICD-10-CM | POA: Diagnosis not present

## 2018-07-25 DIAGNOSIS — Z1389 Encounter for screening for other disorder: Secondary | ICD-10-CM | POA: Diagnosis not present

## 2018-07-25 DIAGNOSIS — E1129 Type 2 diabetes mellitus with other diabetic kidney complication: Secondary | ICD-10-CM | POA: Diagnosis not present

## 2018-07-25 DIAGNOSIS — I10 Essential (primary) hypertension: Secondary | ICD-10-CM | POA: Diagnosis not present

## 2018-07-25 DIAGNOSIS — J301 Allergic rhinitis due to pollen: Secondary | ICD-10-CM | POA: Diagnosis not present

## 2018-07-25 DIAGNOSIS — Z681 Body mass index (BMI) 19 or less, adult: Secondary | ICD-10-CM | POA: Diagnosis not present

## 2018-07-25 DIAGNOSIS — D509 Iron deficiency anemia, unspecified: Secondary | ICD-10-CM | POA: Diagnosis not present

## 2018-07-25 DIAGNOSIS — D4101 Neoplasm of uncertain behavior of right kidney: Secondary | ICD-10-CM | POA: Diagnosis not present

## 2018-07-25 DIAGNOSIS — D689 Coagulation defect, unspecified: Secondary | ICD-10-CM | POA: Diagnosis not present

## 2018-07-25 DIAGNOSIS — Z0001 Encounter for general adult medical examination with abnormal findings: Secondary | ICD-10-CM | POA: Diagnosis not present

## 2018-07-25 DIAGNOSIS — J4521 Mild intermittent asthma with (acute) exacerbation: Secondary | ICD-10-CM | POA: Diagnosis not present

## 2018-07-25 DIAGNOSIS — E876 Hypokalemia: Secondary | ICD-10-CM | POA: Diagnosis not present

## 2018-07-25 DIAGNOSIS — M5136 Other intervertebral disc degeneration, lumbar region: Secondary | ICD-10-CM | POA: Diagnosis not present

## 2018-07-25 DIAGNOSIS — D631 Anemia in chronic kidney disease: Secondary | ICD-10-CM | POA: Diagnosis not present

## 2018-07-25 DIAGNOSIS — N186 End stage renal disease: Secondary | ICD-10-CM | POA: Diagnosis not present

## 2018-07-25 DIAGNOSIS — N2581 Secondary hyperparathyroidism of renal origin: Secondary | ICD-10-CM | POA: Diagnosis not present

## 2018-07-25 DIAGNOSIS — M810 Age-related osteoporosis without current pathological fracture: Secondary | ICD-10-CM | POA: Diagnosis not present

## 2018-07-25 DIAGNOSIS — J209 Acute bronchitis, unspecified: Secondary | ICD-10-CM | POA: Diagnosis not present

## 2018-08-03 DIAGNOSIS — D485 Neoplasm of uncertain behavior of skin: Secondary | ICD-10-CM | POA: Diagnosis not present

## 2018-08-03 DIAGNOSIS — L570 Actinic keratosis: Secondary | ICD-10-CM | POA: Diagnosis not present

## 2018-08-03 DIAGNOSIS — D0439 Carcinoma in situ of skin of other parts of face: Secondary | ICD-10-CM | POA: Diagnosis not present

## 2018-08-11 DIAGNOSIS — C44329 Squamous cell carcinoma of skin of other parts of face: Secondary | ICD-10-CM | POA: Diagnosis not present

## 2018-12-16 DIAGNOSIS — L03115 Cellulitis of right lower limb: Secondary | ICD-10-CM | POA: Diagnosis not present

## 2018-12-16 DIAGNOSIS — I10 Essential (primary) hypertension: Secondary | ICD-10-CM | POA: Diagnosis not present

## 2018-12-16 DIAGNOSIS — Z6822 Body mass index (BMI) 22.0-22.9, adult: Secondary | ICD-10-CM | POA: Diagnosis not present

## 2018-12-29 DIAGNOSIS — L989 Disorder of the skin and subcutaneous tissue, unspecified: Secondary | ICD-10-CM | POA: Diagnosis not present

## 2018-12-29 DIAGNOSIS — Z6822 Body mass index (BMI) 22.0-22.9, adult: Secondary | ICD-10-CM | POA: Diagnosis not present

## 2019-02-01 DIAGNOSIS — D485 Neoplasm of uncertain behavior of skin: Secondary | ICD-10-CM | POA: Diagnosis not present

## 2019-02-01 DIAGNOSIS — L570 Actinic keratosis: Secondary | ICD-10-CM | POA: Diagnosis not present

## 2019-02-01 DIAGNOSIS — L719 Rosacea, unspecified: Secondary | ICD-10-CM | POA: Diagnosis not present

## 2019-02-01 DIAGNOSIS — D0439 Carcinoma in situ of skin of other parts of face: Secondary | ICD-10-CM | POA: Diagnosis not present

## 2019-02-01 DIAGNOSIS — Z85828 Personal history of other malignant neoplasm of skin: Secondary | ICD-10-CM | POA: Diagnosis not present

## 2019-02-01 DIAGNOSIS — C44329 Squamous cell carcinoma of skin of other parts of face: Secondary | ICD-10-CM | POA: Diagnosis not present

## 2019-02-09 DIAGNOSIS — C44329 Squamous cell carcinoma of skin of other parts of face: Secondary | ICD-10-CM | POA: Diagnosis not present

## 2019-02-23 DIAGNOSIS — C44329 Squamous cell carcinoma of skin of other parts of face: Secondary | ICD-10-CM | POA: Diagnosis not present

## 2019-03-07 DIAGNOSIS — R5381 Other malaise: Secondary | ICD-10-CM | POA: Diagnosis not present

## 2019-03-07 DIAGNOSIS — R7309 Other abnormal glucose: Secondary | ICD-10-CM | POA: Diagnosis not present

## 2019-03-07 DIAGNOSIS — R739 Hyperglycemia, unspecified: Secondary | ICD-10-CM | POA: Diagnosis not present

## 2019-03-07 DIAGNOSIS — H538 Other visual disturbances: Secondary | ICD-10-CM | POA: Diagnosis not present

## 2019-03-07 DIAGNOSIS — I10 Essential (primary) hypertension: Secondary | ICD-10-CM | POA: Diagnosis not present

## 2019-03-07 DIAGNOSIS — Z6822 Body mass index (BMI) 22.0-22.9, adult: Secondary | ICD-10-CM | POA: Diagnosis not present

## 2019-03-07 DIAGNOSIS — E559 Vitamin D deficiency, unspecified: Secondary | ICD-10-CM | POA: Diagnosis not present

## 2019-03-07 DIAGNOSIS — E7849 Other hyperlipidemia: Secondary | ICD-10-CM | POA: Diagnosis not present

## 2019-03-07 DIAGNOSIS — Z23 Encounter for immunization: Secondary | ICD-10-CM | POA: Diagnosis not present

## 2019-07-19 DIAGNOSIS — Z1389 Encounter for screening for other disorder: Secondary | ICD-10-CM | POA: Diagnosis not present

## 2019-07-19 DIAGNOSIS — K4090 Unilateral inguinal hernia, without obstruction or gangrene, not specified as recurrent: Secondary | ICD-10-CM | POA: Diagnosis not present

## 2019-07-19 DIAGNOSIS — Z0001 Encounter for general adult medical examination with abnormal findings: Secondary | ICD-10-CM | POA: Diagnosis not present

## 2019-07-19 DIAGNOSIS — Z6823 Body mass index (BMI) 23.0-23.9, adult: Secondary | ICD-10-CM | POA: Diagnosis not present

## 2019-07-24 DIAGNOSIS — K4090 Unilateral inguinal hernia, without obstruction or gangrene, not specified as recurrent: Secondary | ICD-10-CM | POA: Diagnosis not present

## 2019-07-28 DIAGNOSIS — Z7982 Long term (current) use of aspirin: Secondary | ICD-10-CM | POA: Diagnosis not present

## 2019-07-28 DIAGNOSIS — Z79899 Other long term (current) drug therapy: Secondary | ICD-10-CM | POA: Diagnosis not present

## 2019-07-28 DIAGNOSIS — E7800 Pure hypercholesterolemia, unspecified: Secondary | ICD-10-CM | POA: Diagnosis not present

## 2019-07-28 DIAGNOSIS — K4090 Unilateral inguinal hernia, without obstruction or gangrene, not specified as recurrent: Secondary | ICD-10-CM | POA: Diagnosis not present

## 2019-07-28 DIAGNOSIS — Z01818 Encounter for other preprocedural examination: Secondary | ICD-10-CM | POA: Diagnosis not present

## 2019-07-28 DIAGNOSIS — F17290 Nicotine dependence, other tobacco product, uncomplicated: Secondary | ICD-10-CM | POA: Diagnosis not present

## 2019-07-28 DIAGNOSIS — I10 Essential (primary) hypertension: Secondary | ICD-10-CM | POA: Diagnosis not present

## 2019-08-01 DIAGNOSIS — K4030 Unilateral inguinal hernia, with obstruction, without gangrene, not specified as recurrent: Secondary | ICD-10-CM | POA: Diagnosis not present

## 2019-08-01 DIAGNOSIS — K4090 Unilateral inguinal hernia, without obstruction or gangrene, not specified as recurrent: Secondary | ICD-10-CM | POA: Diagnosis not present

## 2019-08-04 DIAGNOSIS — E7800 Pure hypercholesterolemia, unspecified: Secondary | ICD-10-CM | POA: Diagnosis not present

## 2019-08-04 DIAGNOSIS — R1903 Right lower quadrant abdominal swelling, mass and lump: Secondary | ICD-10-CM | POA: Diagnosis not present

## 2019-08-04 DIAGNOSIS — N489 Disorder of penis, unspecified: Secondary | ICD-10-CM | POA: Diagnosis not present

## 2019-08-04 DIAGNOSIS — Z7982 Long term (current) use of aspirin: Secondary | ICD-10-CM | POA: Diagnosis not present

## 2019-08-04 DIAGNOSIS — N5089 Other specified disorders of the male genital organs: Secondary | ICD-10-CM | POA: Diagnosis not present

## 2019-08-04 DIAGNOSIS — N492 Inflammatory disorders of scrotum: Secondary | ICD-10-CM | POA: Diagnosis not present

## 2019-08-04 DIAGNOSIS — I10 Essential (primary) hypertension: Secondary | ICD-10-CM | POA: Diagnosis not present

## 2019-08-04 DIAGNOSIS — N4889 Other specified disorders of penis: Secondary | ICD-10-CM | POA: Diagnosis not present

## 2019-08-04 DIAGNOSIS — Z87891 Personal history of nicotine dependence: Secondary | ICD-10-CM | POA: Diagnosis not present

## 2019-08-04 DIAGNOSIS — Z9889 Other specified postprocedural states: Secondary | ICD-10-CM | POA: Diagnosis not present

## 2019-08-04 DIAGNOSIS — Z79899 Other long term (current) drug therapy: Secondary | ICD-10-CM | POA: Diagnosis not present

## 2019-08-04 DIAGNOSIS — K5790 Diverticulosis of intestine, part unspecified, without perforation or abscess without bleeding: Secondary | ICD-10-CM | POA: Diagnosis not present

## 2019-08-30 DIAGNOSIS — D0439 Carcinoma in situ of skin of other parts of face: Secondary | ICD-10-CM | POA: Diagnosis not present

## 2019-08-30 DIAGNOSIS — L570 Actinic keratosis: Secondary | ICD-10-CM | POA: Diagnosis not present

## 2019-08-30 DIAGNOSIS — C44311 Basal cell carcinoma of skin of nose: Secondary | ICD-10-CM | POA: Diagnosis not present

## 2019-08-30 DIAGNOSIS — D485 Neoplasm of uncertain behavior of skin: Secondary | ICD-10-CM | POA: Diagnosis not present

## 2019-09-06 ENCOUNTER — Encounter: Payer: Self-pay | Admitting: Internal Medicine

## 2019-09-07 DIAGNOSIS — C44329 Squamous cell carcinoma of skin of other parts of face: Secondary | ICD-10-CM | POA: Diagnosis not present

## 2019-09-07 DIAGNOSIS — C44311 Basal cell carcinoma of skin of nose: Secondary | ICD-10-CM | POA: Diagnosis not present

## 2019-11-01 DIAGNOSIS — Z125 Encounter for screening for malignant neoplasm of prostate: Secondary | ICD-10-CM | POA: Diagnosis not present

## 2020-03-04 DIAGNOSIS — L570 Actinic keratosis: Secondary | ICD-10-CM | POA: Diagnosis not present

## 2020-04-04 DIAGNOSIS — L089 Local infection of the skin and subcutaneous tissue, unspecified: Secondary | ICD-10-CM | POA: Diagnosis not present

## 2020-04-04 DIAGNOSIS — Z6822 Body mass index (BMI) 22.0-22.9, adult: Secondary | ICD-10-CM | POA: Diagnosis not present

## 2020-04-04 DIAGNOSIS — E441 Mild protein-calorie malnutrition: Secondary | ICD-10-CM | POA: Diagnosis not present

## 2020-07-19 DIAGNOSIS — Z6822 Body mass index (BMI) 22.0-22.9, adult: Secondary | ICD-10-CM | POA: Diagnosis not present

## 2020-07-19 DIAGNOSIS — C44309 Unspecified malignant neoplasm of skin of other parts of face: Secondary | ICD-10-CM | POA: Diagnosis not present

## 2020-07-19 DIAGNOSIS — Z0001 Encounter for general adult medical examination with abnormal findings: Secondary | ICD-10-CM | POA: Diagnosis not present

## 2020-07-19 DIAGNOSIS — Z1331 Encounter for screening for depression: Secondary | ICD-10-CM | POA: Diagnosis not present

## 2020-07-19 DIAGNOSIS — E7849 Other hyperlipidemia: Secondary | ICD-10-CM | POA: Diagnosis not present

## 2020-07-19 DIAGNOSIS — Z1389 Encounter for screening for other disorder: Secondary | ICD-10-CM | POA: Diagnosis not present

## 2020-07-19 DIAGNOSIS — I10 Essential (primary) hypertension: Secondary | ICD-10-CM | POA: Diagnosis not present

## 2020-09-02 DIAGNOSIS — D485 Neoplasm of uncertain behavior of skin: Secondary | ICD-10-CM | POA: Diagnosis not present

## 2020-09-02 DIAGNOSIS — D0339 Melanoma in situ of other parts of face: Secondary | ICD-10-CM | POA: Diagnosis not present

## 2020-09-02 DIAGNOSIS — Z85828 Personal history of other malignant neoplasm of skin: Secondary | ICD-10-CM | POA: Diagnosis not present

## 2020-09-02 DIAGNOSIS — L719 Rosacea, unspecified: Secondary | ICD-10-CM | POA: Diagnosis not present

## 2020-09-02 DIAGNOSIS — L570 Actinic keratosis: Secondary | ICD-10-CM | POA: Diagnosis not present

## 2020-09-19 DIAGNOSIS — C4339 Malignant melanoma of other parts of face: Secondary | ICD-10-CM | POA: Diagnosis not present

## 2020-09-19 DIAGNOSIS — L988 Other specified disorders of the skin and subcutaneous tissue: Secondary | ICD-10-CM | POA: Diagnosis not present

## 2021-01-08 DIAGNOSIS — Z23 Encounter for immunization: Secondary | ICD-10-CM | POA: Diagnosis not present

## 2021-02-25 DIAGNOSIS — Z6821 Body mass index (BMI) 21.0-21.9, adult: Secondary | ICD-10-CM | POA: Diagnosis not present

## 2021-02-25 DIAGNOSIS — E782 Mixed hyperlipidemia: Secondary | ICD-10-CM | POA: Diagnosis not present

## 2021-02-25 DIAGNOSIS — I10 Essential (primary) hypertension: Secondary | ICD-10-CM | POA: Diagnosis not present

## 2021-03-04 DIAGNOSIS — D0461 Carcinoma in situ of skin of right upper limb, including shoulder: Secondary | ICD-10-CM | POA: Diagnosis not present

## 2021-03-04 DIAGNOSIS — D485 Neoplasm of uncertain behavior of skin: Secondary | ICD-10-CM | POA: Diagnosis not present

## 2021-03-04 DIAGNOSIS — L570 Actinic keratosis: Secondary | ICD-10-CM | POA: Diagnosis not present

## 2021-09-01 DIAGNOSIS — L570 Actinic keratosis: Secondary | ICD-10-CM | POA: Diagnosis not present

## 2021-12-08 DIAGNOSIS — E782 Mixed hyperlipidemia: Secondary | ICD-10-CM | POA: Diagnosis not present

## 2021-12-08 DIAGNOSIS — Z1331 Encounter for screening for depression: Secondary | ICD-10-CM | POA: Diagnosis not present

## 2021-12-08 DIAGNOSIS — C44309 Unspecified malignant neoplasm of skin of other parts of face: Secondary | ICD-10-CM | POA: Diagnosis not present

## 2021-12-08 DIAGNOSIS — Z6821 Body mass index (BMI) 21.0-21.9, adult: Secondary | ICD-10-CM | POA: Diagnosis not present

## 2021-12-08 DIAGNOSIS — E7849 Other hyperlipidemia: Secondary | ICD-10-CM | POA: Diagnosis not present

## 2021-12-08 DIAGNOSIS — Z0001 Encounter for general adult medical examination with abnormal findings: Secondary | ICD-10-CM | POA: Diagnosis not present

## 2021-12-08 DIAGNOSIS — I10 Essential (primary) hypertension: Secondary | ICD-10-CM | POA: Diagnosis not present

## 2021-12-08 DIAGNOSIS — Z125 Encounter for screening for malignant neoplasm of prostate: Secondary | ICD-10-CM | POA: Diagnosis not present

## 2022-01-20 DIAGNOSIS — E039 Hypothyroidism, unspecified: Secondary | ICD-10-CM | POA: Diagnosis not present

## 2022-01-20 DIAGNOSIS — Z6821 Body mass index (BMI) 21.0-21.9, adult: Secondary | ICD-10-CM | POA: Diagnosis not present

## 2022-02-17 DIAGNOSIS — Z23 Encounter for immunization: Secondary | ICD-10-CM | POA: Diagnosis not present

## 2022-03-11 DIAGNOSIS — L570 Actinic keratosis: Secondary | ICD-10-CM | POA: Diagnosis not present

## 2022-03-11 DIAGNOSIS — C44311 Basal cell carcinoma of skin of nose: Secondary | ICD-10-CM | POA: Diagnosis not present

## 2022-03-11 DIAGNOSIS — D485 Neoplasm of uncertain behavior of skin: Secondary | ICD-10-CM | POA: Diagnosis not present

## 2022-03-19 DIAGNOSIS — C44311 Basal cell carcinoma of skin of nose: Secondary | ICD-10-CM | POA: Diagnosis not present

## 2022-09-08 DIAGNOSIS — L570 Actinic keratosis: Secondary | ICD-10-CM | POA: Diagnosis not present

## 2022-12-10 DIAGNOSIS — C44309 Unspecified malignant neoplasm of skin of other parts of face: Secondary | ICD-10-CM | POA: Diagnosis not present

## 2022-12-10 DIAGNOSIS — Z1331 Encounter for screening for depression: Secondary | ICD-10-CM | POA: Diagnosis not present

## 2022-12-10 DIAGNOSIS — Z0001 Encounter for general adult medical examination with abnormal findings: Secondary | ICD-10-CM | POA: Diagnosis not present

## 2022-12-10 DIAGNOSIS — E039 Hypothyroidism, unspecified: Secondary | ICD-10-CM | POA: Diagnosis not present

## 2022-12-10 DIAGNOSIS — E7849 Other hyperlipidemia: Secondary | ICD-10-CM | POA: Diagnosis not present

## 2022-12-10 DIAGNOSIS — I10 Essential (primary) hypertension: Secondary | ICD-10-CM | POA: Diagnosis not present

## 2022-12-10 DIAGNOSIS — E782 Mixed hyperlipidemia: Secondary | ICD-10-CM | POA: Diagnosis not present

## 2022-12-10 DIAGNOSIS — Z6820 Body mass index (BMI) 20.0-20.9, adult: Secondary | ICD-10-CM | POA: Diagnosis not present

## 2023-03-17 DIAGNOSIS — L570 Actinic keratosis: Secondary | ICD-10-CM | POA: Diagnosis not present

## 2023-03-17 DIAGNOSIS — D0439 Carcinoma in situ of skin of other parts of face: Secondary | ICD-10-CM | POA: Diagnosis not present

## 2023-03-17 DIAGNOSIS — D485 Neoplasm of uncertain behavior of skin: Secondary | ICD-10-CM | POA: Diagnosis not present

## 2023-03-25 DIAGNOSIS — C44329 Squamous cell carcinoma of skin of other parts of face: Secondary | ICD-10-CM | POA: Diagnosis not present

## 2023-05-12 ENCOUNTER — Other Ambulatory Visit: Payer: Self-pay

## 2023-05-12 ENCOUNTER — Emergency Department (HOSPITAL_COMMUNITY): Payer: PPO

## 2023-05-12 ENCOUNTER — Encounter (HOSPITAL_COMMUNITY): Payer: Self-pay

## 2023-05-12 ENCOUNTER — Emergency Department (HOSPITAL_COMMUNITY)
Admission: EM | Admit: 2023-05-12 | Discharge: 2023-05-12 | Disposition: A | Discharge status: Home / Self Care | Payer: PPO | Attending: Emergency Medicine | Admitting: Emergency Medicine

## 2023-05-12 DIAGNOSIS — S51812A Laceration without foreign body of left forearm, initial encounter: Secondary | ICD-10-CM | POA: Insufficient documentation

## 2023-05-12 DIAGNOSIS — S51011A Laceration without foreign body of right elbow, initial encounter: Secondary | ICD-10-CM | POA: Diagnosis not present

## 2023-05-12 DIAGNOSIS — S51012A Laceration without foreign body of left elbow, initial encounter: Secondary | ICD-10-CM | POA: Diagnosis not present

## 2023-05-12 DIAGNOSIS — Y92009 Unspecified place in unspecified non-institutional (private) residence as the place of occurrence of the external cause: Secondary | ICD-10-CM | POA: Diagnosis not present

## 2023-05-12 DIAGNOSIS — W108XXA Fall (on) (from) other stairs and steps, initial encounter: Secondary | ICD-10-CM | POA: Insufficient documentation

## 2023-05-12 DIAGNOSIS — W19XXXA Unspecified fall, initial encounter: Secondary | ICD-10-CM

## 2023-05-12 DIAGNOSIS — I6782 Cerebral ischemia: Secondary | ICD-10-CM | POA: Diagnosis not present

## 2023-05-12 DIAGNOSIS — S41111A Laceration without foreign body of right upper arm, initial encounter: Secondary | ICD-10-CM | POA: Insufficient documentation

## 2023-05-12 DIAGNOSIS — Z79899 Other long term (current) drug therapy: Secondary | ICD-10-CM | POA: Diagnosis not present

## 2023-05-12 DIAGNOSIS — S51811A Laceration without foreign body of right forearm, initial encounter: Secondary | ICD-10-CM | POA: Insufficient documentation

## 2023-05-12 DIAGNOSIS — S0990XA Unspecified injury of head, initial encounter: Secondary | ICD-10-CM | POA: Insufficient documentation

## 2023-05-12 DIAGNOSIS — M25521 Pain in right elbow: Secondary | ICD-10-CM | POA: Diagnosis not present

## 2023-05-12 DIAGNOSIS — R531 Weakness: Secondary | ICD-10-CM | POA: Insufficient documentation

## 2023-05-12 DIAGNOSIS — M545 Low back pain, unspecified: Secondary | ICD-10-CM | POA: Diagnosis not present

## 2023-05-12 DIAGNOSIS — M47816 Spondylosis without myelopathy or radiculopathy, lumbar region: Secondary | ICD-10-CM | POA: Diagnosis not present

## 2023-05-12 DIAGNOSIS — Z7982 Long term (current) use of aspirin: Secondary | ICD-10-CM | POA: Insufficient documentation

## 2023-05-12 DIAGNOSIS — R000 Tachycardia, unspecified: Secondary | ICD-10-CM | POA: Diagnosis not present

## 2023-05-12 DIAGNOSIS — S4991XA Unspecified injury of right shoulder and upper arm, initial encounter: Secondary | ICD-10-CM | POA: Diagnosis present

## 2023-05-12 DIAGNOSIS — M439 Deforming dorsopathy, unspecified: Secondary | ICD-10-CM | POA: Diagnosis not present

## 2023-05-12 DIAGNOSIS — M25522 Pain in left elbow: Secondary | ICD-10-CM | POA: Diagnosis not present

## 2023-05-12 DIAGNOSIS — I10 Essential (primary) hypertension: Secondary | ICD-10-CM | POA: Insufficient documentation

## 2023-05-12 DIAGNOSIS — T148XXA Other injury of unspecified body region, initial encounter: Secondary | ICD-10-CM

## 2023-05-12 LAB — CBC WITH DIFFERENTIAL/PLATELET
Abs Immature Granulocytes: 0.02 10*3/uL (ref 0.00–0.07)
Basophils Absolute: 0.1 10*3/uL (ref 0.0–0.1)
Basophils Relative: 1 %
Eosinophils Absolute: 0 10*3/uL (ref 0.0–0.5)
Eosinophils Relative: 0 %
HCT: 35.4 % — ABNORMAL LOW (ref 39.0–52.0)
Hemoglobin: 13 g/dL (ref 13.0–17.0)
Immature Granulocytes: 0 %
Lymphocytes Relative: 19 %
Lymphs Abs: 1.5 10*3/uL (ref 0.7–4.0)
MCH: 38.8 pg — ABNORMAL HIGH (ref 26.0–34.0)
MCHC: 36.7 g/dL — ABNORMAL HIGH (ref 30.0–36.0)
MCV: 105.7 fL — ABNORMAL HIGH (ref 80.0–100.0)
Monocytes Absolute: 0.8 10*3/uL (ref 0.1–1.0)
Monocytes Relative: 11 %
Neutro Abs: 5.2 10*3/uL (ref 1.7–7.7)
Neutrophils Relative %: 69 %
Platelets: 260 10*3/uL (ref 150–400)
RBC: 3.35 MIL/uL — ABNORMAL LOW (ref 4.22–5.81)
RDW: 11.8 % (ref 11.5–15.5)
WBC: 7.5 10*3/uL (ref 4.0–10.5)
nRBC: 0 % (ref 0.0–0.2)

## 2023-05-12 LAB — BASIC METABOLIC PANEL
Anion gap: 12 (ref 5–15)
BUN: 8 mg/dL (ref 8–23)
CO2: 22 mmol/L (ref 22–32)
Calcium: 8.4 mg/dL — ABNORMAL LOW (ref 8.9–10.3)
Chloride: 100 mmol/L (ref 98–111)
Creatinine, Ser: 0.98 mg/dL (ref 0.61–1.24)
GFR, Estimated: >60 mL/min (ref >60)
Glucose, Bld: 176 mg/dL — ABNORMAL HIGH (ref 70–99)
Potassium: 3.2 mmol/L — ABNORMAL LOW (ref 3.5–5.1)
Sodium: 134 mmol/L — ABNORMAL LOW (ref 135–145)

## 2023-05-12 LAB — URINALYSIS, ROUTINE W REFLEX MICROSCOPIC
Bilirubin Urine: NEGATIVE
Glucose, UA: 150 mg/dL — AB
Hgb urine dipstick: NEGATIVE
Ketones, ur: NEGATIVE mg/dL
Leukocytes,Ua: NEGATIVE
Nitrite: NEGATIVE
Protein, ur: NEGATIVE mg/dL
Specific Gravity, Urine: 1.009 (ref 1.005–1.030)
pH: 6 (ref 5.0–8.0)

## 2023-05-12 NOTE — ED Provider Notes (Signed)
Physical Exam  BP (!) 157/83 (BP Location: Left Arm)   Pulse (!) 109   Temp 98.5 F (36.9 C) (Axillary)   Resp (!) 25   Ht 5\' 9"  (1.753 m)   Wt 68 kg   SpO2 97%   BMI 22.15 kg/m   Physical Exam Vitals and nursing note reviewed.  Constitutional:      General: He is not in acute distress.    Appearance: Normal appearance. He is not ill-appearing.  HENT:     Head: Normocephalic and atraumatic.     Mouth/Throat:     Mouth: Mucous membranes are dry.     Pharynx: Oropharynx is clear. No oropharyngeal exudate.  Eyes:     Extraocular Movements: Extraocular movements intact.     Conjunctiva/sclera: Conjunctivae normal.  Cardiovascular:     Rate and Rhythm: Normal rate and regular rhythm.  Pulmonary:     Effort: Pulmonary effort is normal. No respiratory distress.     Breath sounds: Normal breath sounds.  Abdominal:     General: Abdomen is flat.     Palpations: Abdomen is soft.     Tenderness: There is no abdominal tenderness.  Musculoskeletal:        General: Signs of injury (Abrasions is noted to the upper extremities bilaterally.) present. No swelling or tenderness.     Right lower leg: No edema.     Left lower leg: No edema.  Skin:    General: Skin is warm and dry.     Coloration: Skin is not jaundiced or pale.     Findings: No erythema.  Neurological:     General: No focal deficit present.     Mental Status: He is alert and oriented to person, place, and time. Mental status is at baseline.     Sensory: No sensory deficit.     Motor: No weakness.     Gait: Gait normal.  Psychiatric:        Mood and Affect: Mood normal.     Procedures  Procedures  ED Course / MDM   Clinical Course as of 05/12/23 2216  Wed May 12, 2023  2021 Handed off by Burgess Amor, PA-C. Hx of 2 falls since yesterday (trips). Fallen down 2 steps, last fall at 3a today. Hit his head. Pain in elbows (x-ray neg), Ct head (neg). Multiple skin tears (non repairable). Currently tachycardic and  general fatigue. No CP, dyspnea. Waiting on orthostatic. Needs ECG. If stable labs can go home.  [CB]    Clinical Course User Index [CB] Lunette Stands, PA-C   Medical Decision Making Amount and/or Complexity of Data Reviewed Labs: ordered. Radiology: ordered.   Patient care was transferred over from [facility]  Time of handoff, patient plan was to check orthostatics, ECG, labs if normal can go home.  Patient originally presented today for several trips falling down 2 stairs.  CT head was negative, elbow x-rays were negative, lumbar x-ray were negative for any acute abnormality.   On reevaluation, patient was ready go home.  Patient was noted to be slightly tachycardic with a rate of 103.  Sinus rhythm.  CBC was notable for slightly decreased red blood cell and increased MCV, MCH, MCHC. BMP was notable for mild hypokalemia and mild hypocalcemia. UA was unremarkable.  Patient was walked and shown to be stable and not hypoxic or short of breath when walking.  On reevaluation patient heart rate had lowered to the mid 90s sustained.  Patient did not express any  chest pain, shortness of breath, fever, abdominal pain, dizziness, headache, vision changes, dysuria.  Low suspicion for any emergent pathology present at this time, including PE, ACS, CVA, intracranial bleed.  Patient expressed desire to go home.  Vital signs were stable and follow-up reevaluations.  Patient provided strict return to ER precautions.  Patient has to follow-up with PCP for further evaluation at this time.  I believe this patient is to be discharged at this time.        Lunette Stands, [place] 05/12/23 2329    Vanetta Mulders, MD 05/14/23 [phone]

## 2023-05-12 NOTE — ED Notes (Signed)
Wound care completed to bilateral arms.

## 2023-05-12 NOTE — ED Notes (Signed)
Patient transported to CT

## 2023-05-12 NOTE — ED Triage Notes (Signed)
Pt arrived via POV from home c/o multiple injuries and skin tears to bilateral upper extremities. During Triage, all wounds on proximal and distal upper extremities have been irrigated and saline soaked gauze with guaze wrap put in place. Pt denies LOC or hitting his head. Pt reports he fell down some stairs at home yesterday.

## 2023-05-12 NOTE — ED Provider Notes (Signed)
[place] EMERGENCY DEPARTMENT AT [facility] Provider Note   CSN: [id number] Arrival date & time: 05/12/23  1029     History  Chief Complaint  Patient presents with   Gordon Bell is a 78 y.o. male with a history including hypertension, hypercholesterolemia presenting for evaluation of skin tears on his upper arms along with pain in his bilateral elbows and lower back from injury sustained from multiple falls.  He endorses 2 falls in the last 24 hours associated with generalized weakness, he describes a step down from his bedroom to a bathroom and then another 2 steps into the other part of the house and he has tripped going down the steps twice since yesterday.  He did hit the top of his head on the wall with his last fall which occurred at 3 AM this morning.  He denies dizziness or persistent headache and has had no nausea or vomiting, dizziness or confusion since the fall. He also denies chest pain and sob.  The history is provided by the patient.       Home Medications Prior to Admission medications   Medication Sig Start Date End Date Taking? Authorizing Provider  aspirin 81 MG tablet Take 81 mg by mouth daily.      [provider]  cephALEXin (KEFLEX) 500 MG capsule Take 1 capsule (500 mg total) by mouth 3 (three) times daily. 11/27/17   Ivery Quale, PA-C  fish oil-omega-3 fatty acids 1000 MG capsule Take 2 g by mouth daily.      [provider]  lisinopril (PRINIVIL,ZESTRIL) 10 MG tablet Take 10 mg by mouth daily.  07/16/14   [provider]  lovastatin (MEVACOR) 20 MG tablet Take 20 mg by mouth daily at 6 PM.  07/16/14   [provider]  niacin (NIASPAN) 500 MG CR tablet Take 500 mg by mouth at bedtime.    [provider]  polyethylene glycol-electrolytes (NULYTELY/GOLYTELY) 420 G solution Take 4,000 mLs by mouth once. Patient not taking: Reported on 12/14/2014 09/12/14   Anice Paganini, NP      Allergies     Patient has no known allergies.    Review of Systems   Review of Systems  Constitutional:  Negative for chills and fever.  HENT:  Negative for congestion and sore throat.   Eyes: Negative.   Respiratory:  Negative for chest tightness and shortness of breath.   Cardiovascular:  Negative for chest pain.  Gastrointestinal:  Negative for abdominal pain and nausea.  Genitourinary: Negative.   Musculoskeletal:  Positive for arthralgias. Negative for joint swelling and neck pain.  Skin:  Positive for wound. Negative for rash.  Neurological:  Positive for weakness and headaches. Negative for dizziness, light-headedness and numbness.  Psychiatric/Behavioral: Negative.      Physical Exam Updated Vital Signs BP (!) 157/74 (BP Location: Right Arm)   Pulse (!) 110   Temp 98.6 F (37 C) (Oral)   Resp 18   Ht 5\' 9"  (1.753 m)   Wt 68 kg   SpO2 99%   BMI 22.15 kg/m  Physical Exam Vitals and nursing note reviewed.  Constitutional:      Appearance: He is well-developed.  HENT:     Head: Normocephalic.     Comments: Tender to palpation frontal superior scalp, no hematoma appreciated. Eyes:     Extraocular Movements: Extraocular movements intact.     Conjunctiva/sclera: Conjunctivae normal.  Cardiovascular:     Rate and  Rhythm: Regular rhythm. Tachycardia present.     Pulses: Normal pulses.     Heart sounds: Normal heart sounds.  Pulmonary:     Effort: Pulmonary effort is normal.     Breath sounds: Normal breath sounds. No wheezing.  Musculoskeletal:        General: Tenderness present. Normal range of motion.     Cervical back: Normal range of motion.     Comments: Tender to palpation bilateral elbows, he has fair range of motion without deformity.  Equal grip strength.  He has multiple skin tears, the largest being at his right lateral upper arm, it is superficial, there is no appreciable flap that is reducible.  He has multiple smaller skin tears around both elbows and upper forearms,  coin sized areas.  Skin:    General: Skin is warm and dry.  Neurological:     General: No focal deficit present.     Mental Status: He is alert and oriented to person, place, and time.     ED Results / Procedures / Treatments   Labs (all labs ordered are listed, but only abnormal results are displayed) Labs Reviewed  CBC WITH DIFFERENTIAL/PLATELET  BASIC METABOLIC PANEL  URINALYSIS, ROUTINE W REFLEX MICROSCOPIC    EKG None  Radiology CT Head Wo Contrast Result Date: 05/12/2023 CLINICAL DATA:  Head trauma, minor (Age >= 65y). Fall down stairs yesterday. EXAM: CT HEAD WITHOUT CONTRAST TECHNIQUE: Contiguous axial images were obtained from the base of the skull through the vertex without intravenous contrast. RADIATION DOSE REDUCTION: This exam was performed according to the departmental dose-optimization program which includes automated exposure control, adjustment of the mA and/or kV according to patient size and/or use of iterative reconstruction technique. COMPARISON:  None Available. FINDINGS: Brain: There is no evidence of an acute infarct, intracranial hemorrhage, mass, midline shift, or extra-axial fluid collection. Mild generalized cerebral atrophy is within limits for age. Hypodensities in the cerebral white matter are nonspecific but compatible with mild chronic small vessel ischemic disease. Vascular: Calcified atherosclerosis at the skull base. No hyperdense vessel. Skull: No acute fracture or suspicious osseous lesion. Sinuses/Orbits: Visualized paranasal sinuses and mastoid air cells are clear. Unremarkable orbits. Other: None. IMPRESSION: 1. No evidence of acute intracranial abnormality. 2. Mild chronic small vessel ischemic disease. Electronically Signed   By: Sebastian Ache M.D.   On: 05/12/2023 19:16   DG Elbow Complete Right Result Date: 05/12/2023 CLINICAL DATA:  Pain after fall EXAM: RIGHT ELBOW - COMPLETE 4 VIEW COMPARISON:  None Available. FINDINGS: No fracture or  dislocation. Preserved joint spaces and bone mineralization. No joint effusion on lateral view. IMPRESSION: No acute osseous abnormality Electronically Signed   By: Karen Kays M.D.   On: 05/12/2023 13:28   DG Elbow Complete Left Result Date: 05/12/2023 CLINICAL DATA:  Pain after fall EXAM: LEFT ELBOW - COMPLETE 4 VIEW COMPARISON:  None Available. FINDINGS: There is no evidence of fracture, dislocation, or joint effusion. There is no evidence of arthropathy or other focal bone abnormality. Soft tissues are unremarkable. IMPRESSION: No acute osseous abnormality Electronically Signed   By: Karen Kays M.D.   On: 05/12/2023 13:27   DG Lumbar Spine 2-3 Views Result Date: 05/12/2023 CLINICAL DATA:  Pain after fall EXAM: LUMBAR SPINE - 3 VIEW COMPARISON:  None Available. FINDINGS: Five lumbar-type vertebral bodies. Preserved a body height. Moderate multilevel disc height loss seen including L3-4 and L4-5. Mild elsewhere. Scattered mild endplate osteophytes. Prominent lower lumbar facet degenerative changes. No listhesis. Recommend  continue precautions until clinical clearance and if there is further concern of injury additional workup with CT as clinically appropriate. Elsewhere scattered vascular calcifications are seen. IMPRESSION: Mild curvature of the spine. Moderate multifocal degenerative changes particularly from L3 through L5 Electronically Signed   By: Karen Kays M.D.   On: 05/12/2023 13:25    Procedures Procedures    Medications Ordered in ED Medications - No data to display  ED Course/ Medical Decision Making/ A&P Clinical Course as of 05/12/23 2038  Wed May 12, 2023  2021 Handed off by Burgess Amor, PA-C. Hx of 2 falls since yesterday (trips). Fallen down 2 steps, last fall at 3a today. Hit his head. Pain in elbows (x-ray neg), Ct head (neg). Multiple skin tears (non repairable). Currently tachycardic and general fatigue. No CP, dyspnea. Waiting on orthostatic. Needs ECG. If stable labs  can go home.  [CB]    Clinical Course User Index [CB] Lunette Stands, PA-C                                 Medical Decision Making Patient presenting with 2 falls since yesterday, endorses generalized weakness without other complaint except for injury sustained in the falls including superficial skin tears of his bilateral elbow and upper arm regions.  He also hit the top of his head, denies LOC.  He he is tachycardic here with pulse rate between 110 and 115 during exam, unclear etiology, denies chest pain, denies shortness of breath.  His wounds have been dressed using Xeroform, Telfa and Kling,  Pending labs, EKG and orthostatic vital signs.  If these are stable, patient can probably be disposed home with close follow-up with PCP.  Patient signed out to Michae Kava PA-C  Amount and/or Complexity of Data Reviewed Labs: ordered. Radiology: ordered.           Final Clinical Impression(s) / ED Diagnoses Final diagnoses:  Fall, initial encounter  Minor head injury, initial encounter  Multiple skin tears  Tachycardia    Rx / DC Orders ED Discharge Orders     None         Victoriano Lain 05/12/23 2038    Vanetta Mulders, MD 05/14/23 [phone]

## 2023-05-12 NOTE — Discharge Instructions (Signed)
Patient was walked you were seen today for a fall.  Your lab work and imaging were very reassuring for no acute pathology or fracture at this time.  You were kept longer due to your tachycardia.  Recommend that you follow-up with your primary care physician for further workup at this time.   On reevaluation though your heart rate did come back down to a normal rate.  This was also very reassuring considering that you did not get short of breath or unstable while walking.  Recommend that you take precautions when at home to walk with cane and/or walker until further evaluated by PCP.  If you begin experiencing worsening dizziness, chest pain, shortness of breath, confusion, fever return to the ED for reevaluation.

## 2023-05-20 DIAGNOSIS — Z6821 Body mass index (BMI) 21.0-21.9, adult: Secondary | ICD-10-CM | POA: Diagnosis not present

## 2023-05-20 DIAGNOSIS — S40811D Abrasion of right upper arm, subsequent encounter: Secondary | ICD-10-CM | POA: Diagnosis not present

## 2023-05-20 DIAGNOSIS — S40812D Abrasion of left upper arm, subsequent encounter: Secondary | ICD-10-CM | POA: Diagnosis not present

## 2023-05-20 DIAGNOSIS — E039 Hypothyroidism, unspecified: Secondary | ICD-10-CM | POA: Diagnosis not present

## 2023-06-27 ENCOUNTER — Ambulatory Visit (INDEPENDENT_AMBULATORY_CARE_PROVIDER_SITE_OTHER)

## 2023-06-27 ENCOUNTER — Ambulatory Visit: Admission: EM | Admit: 2023-06-27 | Discharge: 2023-06-27 | Disposition: A | Discharge status: Home / Self Care

## 2023-06-27 DIAGNOSIS — R0789 Other chest pain: Secondary | ICD-10-CM | POA: Diagnosis not present

## 2023-06-27 DIAGNOSIS — M549 Dorsalgia, unspecified: Secondary | ICD-10-CM | POA: Diagnosis not present

## 2023-06-27 DIAGNOSIS — I700 Atherosclerosis of aorta: Secondary | ICD-10-CM | POA: Diagnosis not present

## 2023-06-27 DIAGNOSIS — R079 Chest pain, unspecified: Secondary | ICD-10-CM | POA: Diagnosis not present

## 2023-06-27 DIAGNOSIS — W19XXXA Unspecified fall, initial encounter: Secondary | ICD-10-CM

## 2023-06-27 DIAGNOSIS — M47816 Spondylosis without myelopathy or radiculopathy, lumbar region: Secondary | ICD-10-CM | POA: Diagnosis not present

## 2023-06-27 DIAGNOSIS — M419 Scoliosis, unspecified: Secondary | ICD-10-CM | POA: Diagnosis not present

## 2023-06-27 DIAGNOSIS — R059 Cough, unspecified: Secondary | ICD-10-CM | POA: Diagnosis not present

## 2023-06-27 MED ORDER — LIDOCAINE 5 % EX PTCH: 1.0000 | MEDICATED_PATCH | CUTANEOUS | 0 refills | Status: AC

## 2023-06-27 NOTE — Discharge Instructions (Signed)
 X-ray of the chest was negative for a rib fracture.  The lumbar spine does show degenerative changes, but no fractures or dislocations. Apply medication as prescribed.  He may take over-the-counter Tylenol 650 mg tablets as needed for pain or discomfort. Apply warm compresses to the affected area to help with pain or discomfort.  Apply for 20 minutes, remove for 1 hour, repeat as needed.  Please apply a cloth in between the heat source and his skin. Encouraged him to cough and deep breathe regularly to prevent the development of pneumonia. If symptoms appear to be worsening, please follow-up with his primary care physician for further evaluation. Follow-up as needed.

## 2023-06-27 NOTE — ED Triage Notes (Signed)
 Per daughter pt fell while going to the restroom, there was a step up into the hall that pt missed while going to the bathroom. Landed on his right side bruising his buttocks, lower back and arm also has skin tears present.

## 2023-06-27 NOTE — ED Provider Notes (Signed)
 [facility]    CSN: [id number] Arrival date & time: 06/27/23  1032      History   Chief Complaint No chief complaint on file.   HPI Gordon Bell is a 78 y.o. male.   The history is provided by the patient and a relative (Daughter).   Patient brought in by his daughter for complaints of right sided low back pain, skin tears to the right upper extremity, and pain in the right buttock after he fell approximately 2 days ago.  Patient denies hitting his head, although he appears mildly confused.  Daughter states patient has complained of worsening pain that started today.  Patient denies numbness, tingling, radiation of pain, or loss of bowel or bladder function.  Daughter states patient has bruising to the right buttock and to the right mid back.  Daughter reports 3 falls over the past several weeks.  Past Medical History:  Diagnosis Date   HTN (hypertension)    Hypercholesterolemia    S/P colonoscopy 2005    Patient Active Problem List   Diagnosis Date Noted   History of colonic polyps    Diverticulosis of colon without hemorrhage    Abdominal fullness 09/12/2014   High risk for colon cancer 09/12/2014   Pre-op testing 08/07/2010   Abdominal pain, left lower quadrant 08/07/2010    Past Surgical History:  Procedure Laterality Date   COLONOSCOPY  2005   COLONOSCOPY N/A 10/10/2014   Rectal and colonic polyps removed as described above. colonic diverticulosis       Home Medications    Prior to Admission medications   Medication Sig Start Date End Date Taking? Authorizing Provider  Cholecalciferol (VITAMIN D-3) 25 MCG (1000 UT) CAPS Take by mouth.   Yes [provider]  levothyroxine (SYNTHROID) 50 MCG tablet Take 50 mcg by mouth daily before breakfast. 05/24/23  Yes [provider]  lidocaine (LIDODERM) 5 % Place 1 patch onto the skin daily. Remove & Discard patch within 12 hours or as directed by MD 06/27/23  Yes Leath-Warren, Sadie Haber,  NP  aspirin 81 MG tablet Take 81 mg by mouth daily.      [provider]  cephALEXin (KEFLEX) 500 MG capsule Take 1 capsule (500 mg total) by mouth 3 (three) times daily. 11/27/17   Ivery Quale, PA-C  fish oil-omega-3 fatty acids 1000 MG capsule Take 2 g by mouth daily.      [provider]  lisinopril (PRINIVIL,ZESTRIL) 10 MG tablet Take 10 mg by mouth daily.  07/16/14   [provider]  lovastatin (MEVACOR) 20 MG tablet Take 20 mg by mouth daily at 6 PM.  07/16/14   [provider]  niacin (NIASPAN) 500 MG CR tablet Take 500 mg by mouth at bedtime.    [provider]  polyethylene glycol-electrolytes (NULYTELY/GOLYTELY) 420 G solution Take 4,000 mLs by mouth once. Patient not taking: Reported on 12/14/2014 09/12/14   Anice Paganini, NP    Family History Family History  Problem Relation Age of Onset   Diabetes Mother        deceased   Colon cancer Brother 62   Colon cancer Brother 53    Social History Social History   Tobacco Use   Smoking status: Former    Passive exposure: Never   Smokeless tobacco: Current    Types: Chew   Tobacco comments:    Quit x 30 years  Vaping Use   Vaping status: Never Used  Substance Use  Topics   Alcohol use: Yes    Alcohol/week: 14.0 standard drinks of alcohol    Types: 14 Cans of beer per week    Comment: 2-3 beers per day   Drug use: No     Allergies   Patient has no known allergies.   Review of Systems Review of Systems Per HPI  Physical Exam Triage Vital Signs ED Triage Vitals [06/27/23 1126]  Encounter Vitals Group     BP (!) 164/81     Systolic BP Percentile      Diastolic BP Percentile      Pulse Rate 96     Resp 18     Temp 98.6 F (37 C)     Temp Source Oral     SpO2 98 %     Weight      Height      Head Circumference      Peak Flow      Pain Score      Pain Loc      Pain Education      Exclude from Growth Chart    No data found.  Updated Vital Signs BP (!) 164/81 (BP  Location: Right Arm)   Pulse 96   Temp 98.6 F (37 C) (Oral)   Resp 18   SpO2 98%   Visual Acuity Right Eye Distance:   Left Eye Distance:   Bilateral Distance:    Right Eye Near:   Left Eye Near:    Bilateral Near:     Physical Exam Vitals and nursing note reviewed.  Constitutional:      General: He is not in acute distress.    Appearance: Normal appearance.  HENT:     Head: Normocephalic.  Eyes:     Extraocular Movements: Extraocular movements intact.     Pupils: Pupils are equal, round, and reactive to light.  Cardiovascular:     Rate and Rhythm: Normal rate and regular rhythm.     Pulses: Normal pulses.     Heart sounds: Normal heart sounds.  Pulmonary:     Effort: Pulmonary effort is normal. No respiratory distress.     Breath sounds: Normal breath sounds. No stridor. No wheezing, rhonchi or rales.  Abdominal:     General: Bowel sounds are normal.     Palpations: Abdomen is soft.     Tenderness: There is no abdominal tenderness.  Musculoskeletal:     Cervical back: Normal range of motion.     Thoracic back: Swelling, signs of trauma and tenderness present. No edema or deformity. Decreased range of motion.     Lumbar back: Signs of trauma and tenderness present. No swelling or deformity. Decreased range of motion.     Right hip: Tenderness (Tenderness noted to the greater trochanter.  There is bruising noted to the right buttock in multiple stages of healing.) present.     Comments: Bruising and swelling noted to the right thoracic spine between T7-T9.  Lymphadenopathy:     Cervical: No cervical adenopathy.  Skin:    General: Skin is warm and dry.  Neurological:     General: No focal deficit present.     Mental Status: He is alert and oriented to person, place, and time.  Psychiatric:        Mood and Affect: Mood normal.        Behavior: Behavior normal.      UC Treatments / Results  Labs (all labs ordered are listed, but only abnormal results  are  displayed) Labs Reviewed - No data to display  EKG   Radiology DG Chest 2 View Result Date: 06/27/2023 CLINICAL DATA:  Status post fall 2 days ago. Right sided pain with breathing. Coughing. EXAM: CHEST - 2 VIEW COMPARISON:  None Available. FINDINGS: Cardiac silhouette is normal in size and configuration. Normal mediastinal and hilar contours. Clear lungs.  No pleural effusion or pneumothorax. Skeletal structures are intact. IMPRESSION: No active cardiopulmonary disease. Electronically Signed   By: Amie Portland M.D.   On: 06/27/2023 12:39   DG Lumbar Spine Complete Result Date: 06/27/2023 CLINICAL DATA:  Fall.  Right sided back pain. EXAM: LUMBAR SPINE - COMPLETE 4+ VIEW COMPARISON:  05/12/2023. FINDINGS: No fracture or bone lesion. No spondylolisthesis. Mild dextroscoliosis, stable. Mild loss of disc height at L1-L2. Moderate loss of disc height at L2-L3 no L3-L4 and L4-L5. Endplate osteophytes noted from the lower thoracic spine through L5. Facet joints are relatively well preserved. Aortic atherosclerosis.  Soft tissues otherwise unremarkable. IMPRESSION: 1. No fracture or acute finding. 2. Multilevel disc degenerative changes. Electronically Signed   By: Amie Portland M.D.   On: 06/27/2023 12:38    Procedures Procedures (including critical care time)  Medications Ordered in UC Medications - No data to display  Initial Impression / Assessment and Plan / UC Course  I have reviewed the triage vital signs and the nursing notes.  Pertinent labs & imaging results that were available during my care of the patient were reviewed by me and considered in my medical decision making (see chart for details).  X-ray of the lumbar spine shows degenerative changes, no acute fractures or dislocation.  Chest x-ray was also negative for rib fracture.  Suspect lumbar and thoracic contusions based on the mechanism of injury.  Lidocaine 5% patches prescribed to apply to the affected areas.  Supportive care  recommendations were provided and discussed to include warm compresses to the affected areas, over-the-counter Tylenol, and encouraging the patient to cough and deep breathe to prevent the development of pneumonia.  Discussed indications for follow-up with patient's daughter.  Daughter was in agreement with this plan of care and verbalized understanding.  All questions were answered.  Patient stable for discharge.  Final Clinical Impressions(s) / UC Diagnoses   Final diagnoses:  Right-sided back pain, unspecified back location, unspecified chronicity  Right-sided chest wall pain     Discharge Instructions      X-ray of the chest was negative for a rib fracture.  The lumbar spine does show degenerative changes, but no fractures or dislocations. Apply medication as prescribed.  He may take over-the-counter Tylenol 650 mg tablets as needed for pain or discomfort. Apply warm compresses to the affected area to help with pain or discomfort.  Apply for 20 minutes, remove for 1 hour, repeat as needed.  Please apply a cloth in between the heat source and his skin. Encouraged him to cough and deep breathe regularly to prevent the development of pneumonia. If symptoms appear to be worsening, please follow-up with his primary care physician for further evaluation. Follow-up as needed.      ED Prescriptions     Medication Sig Dispense Auth. Provider   lidocaine (LIDODERM) 5 % Place 1 patch onto the skin daily. Remove & Discard patch within 12 hours or as directed by MD 30 patch Leath-Warren, Sadie Haber, NP      PDMP not reviewed this encounter.   Abran Cantor, NP 06/27/23 1246

## 2023-07-07 DIAGNOSIS — C44311 Basal cell carcinoma of skin of nose: Secondary | ICD-10-CM | POA: Diagnosis not present

## 2023-08-17 DIAGNOSIS — C44311 Basal cell carcinoma of skin of nose: Secondary | ICD-10-CM | POA: Diagnosis not present

## 2023-09-28 DIAGNOSIS — L570 Actinic keratosis: Secondary | ICD-10-CM | POA: Diagnosis not present

## 2023-11-03 DIAGNOSIS — F1090 Alcohol use, unspecified, uncomplicated: Secondary | ICD-10-CM | POA: Diagnosis not present

## 2023-11-03 DIAGNOSIS — E039 Hypothyroidism, unspecified: Secondary | ICD-10-CM | POA: Diagnosis not present

## 2023-11-03 DIAGNOSIS — E782 Mixed hyperlipidemia: Secondary | ICD-10-CM | POA: Diagnosis not present

## 2023-11-03 DIAGNOSIS — Z7689 Persons encountering health services in other specified circumstances: Secondary | ICD-10-CM | POA: Diagnosis not present

## 2023-11-03 DIAGNOSIS — I10 Essential (primary) hypertension: Secondary | ICD-10-CM | POA: Diagnosis not present

## 2023-11-11 DIAGNOSIS — I10 Essential (primary) hypertension: Secondary | ICD-10-CM | POA: Diagnosis not present

## 2023-11-11 DIAGNOSIS — E039 Hypothyroidism, unspecified: Secondary | ICD-10-CM | POA: Diagnosis not present

## 2023-11-11 DIAGNOSIS — E782 Mixed hyperlipidemia: Secondary | ICD-10-CM | POA: Diagnosis not present

## 2023-11-11 DIAGNOSIS — F1090 Alcohol use, unspecified, uncomplicated: Secondary | ICD-10-CM | POA: Diagnosis not present

## 2023-11-17 ENCOUNTER — Other Ambulatory Visit (HOSPITAL_COMMUNITY): Payer: Self-pay

## 2023-11-17 DIAGNOSIS — Z79890 Hormone replacement therapy (postmenopausal): Secondary | ICD-10-CM | POA: Diagnosis not present

## 2023-11-17 DIAGNOSIS — I10 Essential (primary) hypertension: Secondary | ICD-10-CM | POA: Diagnosis not present

## 2023-11-17 DIAGNOSIS — Z79899 Other long term (current) drug therapy: Secondary | ICD-10-CM | POA: Diagnosis not present

## 2023-11-17 DIAGNOSIS — Z2821 Immunization not carried out because of patient refusal: Secondary | ICD-10-CM | POA: Diagnosis not present

## 2023-11-17 DIAGNOSIS — R7303 Prediabetes: Secondary | ICD-10-CM | POA: Diagnosis not present

## 2023-11-17 DIAGNOSIS — Z87891 Personal history of nicotine dependence: Secondary | ICD-10-CM | POA: Diagnosis not present

## 2023-11-17 DIAGNOSIS — E039 Hypothyroidism, unspecified: Secondary | ICD-10-CM | POA: Diagnosis not present

## 2023-11-17 DIAGNOSIS — E782 Mixed hyperlipidemia: Secondary | ICD-10-CM | POA: Diagnosis not present

## 2023-11-17 DIAGNOSIS — R748 Abnormal levels of other serum enzymes: Secondary | ICD-10-CM | POA: Diagnosis not present

## 2023-11-17 DIAGNOSIS — F1090 Alcohol use, unspecified, uncomplicated: Secondary | ICD-10-CM | POA: Diagnosis not present

## 2023-11-26 ENCOUNTER — Ambulatory Visit (HOSPITAL_COMMUNITY)
Admission: RE | Admit: 2023-11-26 | Discharge: 2023-11-26 | Disposition: A | Discharge status: Home / Self Care | Source: Ambulatory Visit

## 2023-11-26 DIAGNOSIS — K7689 Other specified diseases of liver: Secondary | ICD-10-CM | POA: Diagnosis not present

## 2023-11-26 DIAGNOSIS — R748 Abnormal levels of other serum enzymes: Secondary | ICD-10-CM | POA: Diagnosis not present

## 2023-11-26 DIAGNOSIS — R7989 Other specified abnormal findings of blood chemistry: Secondary | ICD-10-CM | POA: Diagnosis not present

## 2023-12-10 DIAGNOSIS — Z720 Tobacco use: Secondary | ICD-10-CM | POA: Diagnosis not present

## 2023-12-10 DIAGNOSIS — M950 Acquired deformity of nose: Secondary | ICD-10-CM | POA: Diagnosis not present

## 2023-12-10 DIAGNOSIS — C44311 Basal cell carcinoma of skin of nose: Secondary | ICD-10-CM | POA: Diagnosis not present

## 2023-12-10 DIAGNOSIS — I10 Essential (primary) hypertension: Secondary | ICD-10-CM | POA: Diagnosis not present

## 2023-12-29 DIAGNOSIS — C44311 Basal cell carcinoma of skin of nose: Secondary | ICD-10-CM | POA: Diagnosis not present

## 2023-12-30 DIAGNOSIS — Z483 Aftercare following surgery for neoplasm: Secondary | ICD-10-CM | POA: Diagnosis not present

## 2023-12-30 DIAGNOSIS — Z85828 Personal history of other malignant neoplasm of skin: Secondary | ICD-10-CM | POA: Diagnosis not present

## 2023-12-30 DIAGNOSIS — C44311 Basal cell carcinoma of skin of nose: Secondary | ICD-10-CM | POA: Diagnosis not present

## 2023-12-30 DIAGNOSIS — Z9889 Other specified postprocedural states: Secondary | ICD-10-CM | POA: Diagnosis not present

## 2023-12-30 DIAGNOSIS — M950 Acquired deformity of nose: Secondary | ICD-10-CM | POA: Diagnosis not present

## 2024-01-27 ENCOUNTER — Ambulatory Visit: Admit: 2024-01-27 | Admitting: Otolaryngology

## 2024-01-27 SURGERY — CREATION, FLAP, PEDICLED ROTATION, FOREHEAD AND NOSE
Anesthesia: General

## 2024-02-03 DIAGNOSIS — M950 Acquired deformity of nose: Secondary | ICD-10-CM | POA: Diagnosis not present

## 2024-02-03 DIAGNOSIS — C44311 Basal cell carcinoma of skin of nose: Secondary | ICD-10-CM | POA: Diagnosis not present

## 2024-02-03 HISTORY — PX: FOREHEAD FLAP: SHX1676

## 2024-02-08 ENCOUNTER — Ambulatory Visit
Admission: EM | Admit: 2024-02-08 | Discharge: 2024-02-08 | Disposition: A | Discharge status: Home / Self Care | Attending: Family Medicine | Admitting: Family Medicine

## 2024-02-08 DIAGNOSIS — H1132 Conjunctival hemorrhage, left eye: Secondary | ICD-10-CM

## 2024-02-08 MED ORDER — ERYTHROMYCIN 5 MG/GM OP OINT: TOPICAL_OINTMENT | OPHTHALMIC | 0 refills | Status: AC

## 2024-02-08 NOTE — ED Triage Notes (Signed)
 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 States left eye redness is worsening.

## 2024-02-08 NOTE — ED Notes (Signed)
 Could not perform [facility] as patient because pt did not have his corrective lenses.

## 2024-02-11 NOTE — ED Provider Notes (Signed)
 [facility]    CSN: [id number] Arrival date & time: 02/08/24  9044      History   Chief Complaint No chief complaint on file.   HPI Gordon Bell is a 78 y.o. male.   Patient presenting today with left eye redness for the past few days following a forehead flap surgery last week.  Denies injury to the eye, itching, blurry vision, fevers, headaches beyond expected postop pain, difficulty moving the eye.  So far not try anything over-the-counter for symptoms.    Past Medical History:  Diagnosis Date   HTN (hypertension)    Hypercholesterolemia    S/P colonoscopy 2005    Patient Active Problem List   Diagnosis Date Noted   History of colonic polyps    Diverticulosis of colon without hemorrhage    Abdominal fullness 09/12/2014   High risk for colon cancer 09/12/2014   Pre-op testing 08/07/2010   Abdominal pain, left lower quadrant 08/07/2010    Past Surgical History:  Procedure Laterality Date   COLONOSCOPY  04/14/2003   COLONOSCOPY N/A 10/10/2014   Rectal and colonic polyps removed as described above. colonic diverticulosis   FOREHEAD FLAP Left 02/03/2024       Home Medications    Prior to Admission medications   Medication Sig Start Date End Date Taking? Authorizing Provider  erythromycin ophthalmic ointment Place a 1/2 inch ribbon of ointment into the left lower eyelid BID. 02/08/24  Yes Stuart Vernell Norris, PA-C  aspirin 81 MG tablet Take 81 mg by mouth daily.      [provider]  cephALEXin  (KEFLEX ) 500 MG capsule Take 1 capsule (500 mg total) by mouth 3 (three) times daily. 11/27/17   Armida Culver, PA-C  Cholecalciferol (VITAMIN D-3) 25 MCG (1000 UT) CAPS Take by mouth.    [provider]  fish oil-omega-3 fatty acids 1000 MG capsule Take 2 g by mouth daily.      [provider]  levothyroxine (SYNTHROID) 50 MCG tablet Take 50 mcg by mouth daily before breakfast. 05/24/23   [provider]  lidocaine   (LIDODERM ) 5 % Place 1 patch onto the skin daily. Remove & Discard patch within 12 hours or as directed by MD 06/27/23   Leath-Warren, Etta JINNY, NP  lisinopril (PRINIVIL,ZESTRIL) 10 MG tablet Take 10 mg by mouth daily.  07/16/14   [provider]  lovastatin (MEVACOR) 20 MG tablet Take 20 mg by mouth daily at 6 PM.  07/16/14   [provider]  niacin (NIASPAN) 500 MG CR tablet Take 500 mg by mouth at bedtime.    [provider]  polyethylene glycol-electrolytes (NULYTELY/GOLYTELY) 420 G solution Take 4,000 mLs by mouth once. Patient not taking: Reported on 12/14/2014 09/12/14   Marvis Camellia LABOR, NP    Family History Family History  Problem Relation Age of Onset   Diabetes Mother        deceased   Colon cancer Brother 26   Colon cancer Brother 24    Social History Social History   Tobacco Use   Smoking status: Former    Passive exposure: Never   Smokeless tobacco: Current    Types: Chew   Tobacco comments:    Quit x 30 years  Vaping Use   Vaping status: Never Used  Substance Use Topics   Alcohol use: Yes    Alcohol/week: 14.0 standard drinks of alcohol    Types: 14 Cans of beer per week    Comment: 2-3 beers per  day   Drug use: No     Allergies   Patient has no known allergies.   Review of Systems Review of Systems Per HPI  Physical Exam Triage Vital Signs ED Triage Vitals  Encounter Vitals Group     BP 02/08/24 1027 (!) 149/71     Girls Systolic BP Percentile --      Girls Diastolic BP Percentile --      Boys Systolic BP Percentile --      Boys Diastolic BP Percentile --      Pulse Rate 02/08/24 1027 77     Resp 02/08/24 1027 20     Temp 02/08/24 1027 98.8 F (37.1 C)     Temp Source 02/08/24 1027 Oral     SpO2 02/08/24 1027 97 %     Weight --      Height --      Head Circumference --      Peak Flow --      Pain Score 02/08/24 1029 0     Pain Loc --      Pain Education --      Exclude from Growth Chart --    No data  found.  Updated Vital Signs BP (!) 149/71 (BP Location: Right Arm)   Pulse 77   Temp 98.8 F (37.1 C) (Oral)   Resp 20   SpO2 97%   Visual Acuity Right Eye Distance:   Left Eye Distance:   Bilateral Distance:    Right Eye Near:   Left Eye Near:    Bilateral Near:     Physical Exam Vitals and nursing note reviewed.  Constitutional:      Appearance: Normal appearance.  HENT:     Head: Atraumatic.  Eyes:     Extraocular Movements: Extraocular movements intact.     Pupils: Pupils are equal, round, and reactive to light.     Comments: Left-sided subconjunctival hematoma present, no foreign bodies appreciable, EOMs intact, PERRL  Cardiovascular:     Rate and Rhythm: Normal rate.  Pulmonary:     Effort: Pulmonary effort is normal.  Musculoskeletal:        General: Normal range of motion.     Cervical back: Normal range of motion and neck supple.  Skin:    General: Skin is warm.     Comments: Postop changes from forehead flap surgery present, no evidence of wound infection on exam  Neurological:     General: No focal deficit present.     Mental Status: He is oriented to person, place, and time.  Psychiatric:        Mood and Affect: Mood normal.        Thought Content: Thought content normal.        Judgment: Judgment normal.      UC Treatments / Results  Labs (all labs ordered are listed, but only abnormal results are displayed) Labs Reviewed - No data to display  EKG   Radiology No results found.  Procedures Procedures (including critical care time)  Medications Ordered in UC Medications - No data to display  Initial Impression / Assessment and Plan / UC Course  I have reviewed the triage vital signs and the nursing notes.  Pertinent labs & imaging results that were available during my care of the patient were reviewed by me and considered in my medical decision making (see chart for details).     Visual acuity declined as vision intact per patient.   Consistent with  subconjunctival hematoma.  Treat with erythromycin ointment for comfort and protection against infection while inflammation is present, and discussed supportive home care and return precautions.  Likely related to recent operative procedure to left and central forehead  Final Clinical Impressions(s) / UC Diagnoses   Final diagnoses:  Subconjunctival hematoma, left   Discharge Instructions   None    ED Prescriptions     Medication Sig Dispense Auth. Provider   erythromycin ophthalmic ointment Place a 1/2 inch ribbon of ointment into the left lower eyelid BID. 3.5 g Stuart Vernell Norris, PA-C      PDMP not reviewed this encounter.   Stuart Vernell Norris, [place] 02/11/24 [phone]

## 2024-02-17 DIAGNOSIS — R7303 Prediabetes: Secondary | ICD-10-CM | POA: Diagnosis not present

## 2024-02-17 DIAGNOSIS — I10 Essential (primary) hypertension: Secondary | ICD-10-CM | POA: Diagnosis not present

## 2024-02-23 DIAGNOSIS — I10 Essential (primary) hypertension: Secondary | ICD-10-CM | POA: Diagnosis not present

## 2024-02-23 DIAGNOSIS — R748 Abnormal levels of other serum enzymes: Secondary | ICD-10-CM | POA: Diagnosis not present

## 2024-02-23 DIAGNOSIS — F1090 Alcohol use, unspecified, uncomplicated: Secondary | ICD-10-CM | POA: Diagnosis not present

## 2024-02-23 DIAGNOSIS — Z79890 Hormone replacement therapy (postmenopausal): Secondary | ICD-10-CM | POA: Diagnosis not present

## 2024-02-23 DIAGNOSIS — Z79899 Other long term (current) drug therapy: Secondary | ICD-10-CM | POA: Diagnosis not present

## 2024-02-23 DIAGNOSIS — R7303 Prediabetes: Secondary | ICD-10-CM | POA: Diagnosis not present

## 2024-02-23 DIAGNOSIS — E782 Mixed hyperlipidemia: Secondary | ICD-10-CM | POA: Diagnosis not present

## 2024-02-23 DIAGNOSIS — Z87891 Personal history of nicotine dependence: Secondary | ICD-10-CM | POA: Diagnosis not present

## 2024-02-23 DIAGNOSIS — E039 Hypothyroidism, unspecified: Secondary | ICD-10-CM | POA: Diagnosis not present
